# Patient Record
Sex: Female | Born: 1980 | Race: Black or African American | Hispanic: No | Marital: Married | Smoking: Current every day smoker
Health system: Southern US, Community
[De-identification: ages and names within clinical notes are randomized; demographics above are authoritative.]

## PROBLEM LIST (undated history)

## (undated) ENCOUNTER — Emergency Department (HOSPITAL_COMMUNITY): Admission: EM | Payer: Medicaid Other | Source: Home / Self Care

## (undated) DIAGNOSIS — R61 Generalized hyperhidrosis: Secondary | ICD-10-CM

## (undated) DIAGNOSIS — D219 Benign neoplasm of connective and other soft tissue, unspecified: Secondary | ICD-10-CM

## (undated) DIAGNOSIS — I959 Hypotension, unspecified: Secondary | ICD-10-CM

## (undated) DIAGNOSIS — R42 Dizziness and giddiness: Secondary | ICD-10-CM

## (undated) DIAGNOSIS — R110 Nausea: Secondary | ICD-10-CM

## (undated) DIAGNOSIS — R55 Syncope and collapse: Secondary | ICD-10-CM

## (undated) HISTORY — DX: Nausea: R11.0

## (undated) HISTORY — DX: Generalized hyperhidrosis: R61

## (undated) HISTORY — DX: Syncope and collapse: R55

## (undated) HISTORY — DX: Dizziness and giddiness: R42

## (undated) HISTORY — DX: Hypotension, unspecified: I95.9

## (undated) HISTORY — PX: WISDOM TOOTH EXTRACTION: SHX21

---

## 2000-06-07 ENCOUNTER — Emergency Department (HOSPITAL_COMMUNITY): Admission: EM | Admit: 2000-06-07 | Discharge: 2000-06-07 | Payer: Self-pay | Admitting: Emergency Medicine

## 2000-06-07 ENCOUNTER — Encounter: Payer: Self-pay | Admitting: Emergency Medicine

## 2000-07-12 ENCOUNTER — Emergency Department (HOSPITAL_COMMUNITY): Admission: EM | Admit: 2000-07-12 | Discharge: 2000-07-13 | Payer: Self-pay | Admitting: Emergency Medicine

## 2000-08-13 ENCOUNTER — Emergency Department (HOSPITAL_COMMUNITY): Admission: EM | Admit: 2000-08-13 | Discharge: 2000-08-14 | Payer: Self-pay | Admitting: Emergency Medicine

## 2002-03-29 ENCOUNTER — Emergency Department (HOSPITAL_COMMUNITY): Admission: EM | Admit: 2002-03-29 | Discharge: 2002-03-29 | Payer: Self-pay | Admitting: *Deleted

## 2002-05-05 ENCOUNTER — Emergency Department (HOSPITAL_COMMUNITY): Admission: EM | Admit: 2002-05-05 | Discharge: 2002-05-06 | Payer: Self-pay | Admitting: Emergency Medicine

## 2002-09-13 ENCOUNTER — Inpatient Hospital Stay (HOSPITAL_COMMUNITY): Admission: AD | Admit: 2002-09-13 | Discharge: 2002-09-13 | Payer: Self-pay | Admitting: Obstetrics and Gynecology

## 2002-10-13 ENCOUNTER — Emergency Department (HOSPITAL_COMMUNITY): Admission: EM | Admit: 2002-10-13 | Discharge: 2002-10-13 | Payer: Self-pay | Admitting: *Deleted

## 2003-01-26 ENCOUNTER — Emergency Department (HOSPITAL_COMMUNITY): Admission: EM | Admit: 2003-01-26 | Discharge: 2003-01-26 | Payer: Self-pay | Admitting: Emergency Medicine

## 2003-03-14 ENCOUNTER — Encounter: Payer: Self-pay | Admitting: Emergency Medicine

## 2003-03-14 ENCOUNTER — Emergency Department (HOSPITAL_COMMUNITY): Admission: EM | Admit: 2003-03-14 | Discharge: 2003-03-14 | Payer: Self-pay | Admitting: Emergency Medicine

## 2003-06-13 ENCOUNTER — Ambulatory Visit (HOSPITAL_COMMUNITY): Admission: RE | Admit: 2003-06-13 | Discharge: 2003-06-13 | Payer: Self-pay | Admitting: Obstetrics & Gynecology

## 2003-07-09 ENCOUNTER — Inpatient Hospital Stay (HOSPITAL_COMMUNITY): Admission: AD | Admit: 2003-07-09 | Discharge: 2003-07-09 | Payer: Self-pay | Admitting: Obstetrics & Gynecology

## 2003-08-24 ENCOUNTER — Ambulatory Visit (HOSPITAL_COMMUNITY): Admission: RE | Admit: 2003-08-24 | Discharge: 2003-08-24 | Payer: Self-pay | Admitting: Obstetrics & Gynecology

## 2003-09-01 ENCOUNTER — Inpatient Hospital Stay (HOSPITAL_COMMUNITY): Admission: AD | Admit: 2003-09-01 | Discharge: 2003-09-02 | Payer: Self-pay | Admitting: Obstetrics

## 2003-11-24 ENCOUNTER — Inpatient Hospital Stay (HOSPITAL_COMMUNITY): Admission: AD | Admit: 2003-11-24 | Discharge: 2003-11-28 | Payer: Self-pay | Admitting: Obstetrics & Gynecology

## 2004-06-05 ENCOUNTER — Emergency Department (HOSPITAL_COMMUNITY): Admission: EM | Admit: 2004-06-05 | Discharge: 2004-06-05 | Payer: Self-pay | Admitting: Emergency Medicine

## 2008-10-17 ENCOUNTER — Emergency Department (HOSPITAL_COMMUNITY): Admission: EM | Admit: 2008-10-17 | Discharge: 2008-10-17 | Payer: Self-pay | Admitting: Emergency Medicine

## 2009-04-19 ENCOUNTER — Emergency Department (HOSPITAL_COMMUNITY): Admission: EM | Admit: 2009-04-19 | Discharge: 2009-04-19 | Payer: Self-pay | Admitting: Emergency Medicine

## 2010-01-31 ENCOUNTER — Emergency Department (HOSPITAL_COMMUNITY): Admission: EM | Admit: 2010-01-31 | Discharge: 2010-01-31 | Payer: Self-pay | Admitting: Emergency Medicine

## 2010-04-26 ENCOUNTER — Emergency Department (HOSPITAL_COMMUNITY): Admission: EM | Admit: 2010-04-26 | Discharge: 2009-11-01 | Payer: Self-pay | Admitting: Emergency Medicine

## 2012-04-26 ENCOUNTER — Emergency Department (HOSPITAL_COMMUNITY)
Admission: EM | Admit: 2012-04-26 | Discharge: 2012-04-26 | Disposition: A | Discharge status: Home / Self Care | Payer: Medicaid Other | Attending: Emergency Medicine | Admitting: Emergency Medicine

## 2012-04-26 ENCOUNTER — Encounter (HOSPITAL_COMMUNITY): Payer: Self-pay | Admitting: *Deleted

## 2012-04-26 DIAGNOSIS — X500XXA Overexertion from strenuous movement or load, initial encounter: Secondary | ICD-10-CM | POA: Insufficient documentation

## 2012-04-26 DIAGNOSIS — S46819A Strain of other muscles, fascia and tendons at shoulder and upper arm level, unspecified arm, initial encounter: Secondary | ICD-10-CM | POA: Insufficient documentation

## 2012-04-26 DIAGNOSIS — S46811A Strain of other muscles, fascia and tendons at shoulder and upper arm level, right arm, initial encounter: Secondary | ICD-10-CM

## 2012-04-26 DIAGNOSIS — S43499A Other sprain of unspecified shoulder joint, initial encounter: Secondary | ICD-10-CM | POA: Insufficient documentation

## 2012-04-26 DIAGNOSIS — Y9389 Activity, other specified: Secondary | ICD-10-CM | POA: Insufficient documentation

## 2012-04-26 DIAGNOSIS — Y929 Unspecified place or not applicable: Secondary | ICD-10-CM | POA: Insufficient documentation

## 2012-04-26 DIAGNOSIS — M549 Dorsalgia, unspecified: Secondary | ICD-10-CM | POA: Insufficient documentation

## 2012-04-26 DIAGNOSIS — F17200 Nicotine dependence, unspecified, uncomplicated: Secondary | ICD-10-CM | POA: Insufficient documentation

## 2012-04-26 MED ORDER — CYCLOBENZAPRINE HCL 10 MG PO TABS
10.0000 mg | ORAL_TABLET | Freq: Once | ORAL | Status: AC
  Administered 2012-04-26: 10 mg via ORAL
  Filled 2012-04-26: qty 1

## 2012-04-26 MED ORDER — CYCLOBENZAPRINE HCL 10 MG PO TABS: ORAL_TABLET | ORAL | Status: DC

## 2012-04-26 MED ORDER — IBUPROFEN 800 MG PO TABS
800.0000 mg | ORAL_TABLET | Freq: Once | ORAL | Status: AC
  Administered 2012-04-26: 800 mg via ORAL
  Filled 2012-04-26: qty 1

## 2012-04-26 MED ORDER — HYDROCODONE-ACETAMINOPHEN 5-325 MG PO TABS: 1.0000 | ORAL_TABLET | Freq: Four times a day (QID) | ORAL | Status: AC | PRN

## 2012-04-26 MED ORDER — HYDROCODONE-ACETAMINOPHEN 5-325 MG PO TABS
1.0000 | ORAL_TABLET | Freq: Once | ORAL | Status: AC
  Administered 2012-04-26: 1 via ORAL
  Filled 2012-04-26: qty 1

## 2012-04-26 NOTE — ED Provider Notes (Signed)
History     CSN: [id number]  Arrival date & time 04/26/12  1213   First MD Initiated Contact with Patient 04/26/12 1259      Chief Complaint  Patient presents with  . Shoulder Pain    (Consider location/radiation/quality/duration/timing/severity/associated sxs/prior treatment) HPI Comments: Pt was helping her mother move furniture and strained R upper back 2 days ago   Patient is a 31 y.o. female presenting with shoulder pain. The history is provided by the patient. No language interpreter was used.  Shoulder Pain This is a new problem. The current episode started yesterday. The problem occurs constantly. The problem has been unchanged. Pertinent negatives include no numbness or weakness. Exacerbated by: shoulder movement. She has tried acetaminophen for the symptoms. The treatment provided mild relief.    History reviewed. No pertinent past medical history.  History reviewed. No pertinent past surgical history.  No family history on file.  History  Substance Use Topics  . Smoking status: Current Every Day Smoker    Types: Cigarettes  . Smokeless tobacco: Not on file  . Alcohol Use: Yes     Comment: socially    OB History    Grav Para Term Preterm Abortions TAB SAB Ect Mult Living                  Review of Systems  Musculoskeletal: Positive for back pain.  Neurological: Negative for weakness and numbness.  All other systems reviewed and are negative.    Allergies  Review of patient's allergies indicates no known allergies.  Home Medications   Current Outpatient Rx  Name  Route  Sig  Dispense  Refill  . ACETAMINOPHEN 500 MG PO TABS   Oral   Take 1,500 mg by mouth every 6 (six) hours as needed. For pain         . CYCLOBENZAPRINE HCL 10 MG PO TABS      1/2 to one tab po TID   20 tablet   0   . HYDROCODONE-ACETAMINOPHEN 5-325 MG PO TABS   Oral   Take 1 tablet by mouth every 6 (six) hours as needed for pain.   20 tablet   0     BP 143/88   Pulse 87  Temp 98.1 F (36.7 C) (Oral)  Resp 17  Ht 5\' 5"  (1.651 m)  Wt 250 lb (113.399 kg)  BMI 41.60 kg/m2  SpO2 99%  LMP 04/01/2012  Physical Exam  Nursing note and vitals reviewed. Constitutional: She is oriented to person, place, and time. She appears well-developed and well-nourished. No distress.  HENT:  Head: Normocephalic and atraumatic.  Eyes: EOM are normal.  Neck: Normal range of motion.  Cardiovascular: Normal rate and regular rhythm.   Pulmonary/Chest: Effort normal.  Abdominal: Soft. She exhibits no distension. There is no tenderness.  Musculoskeletal: She exhibits tenderness.       Thoracic back: She exhibits decreased range of motion, tenderness and pain. She exhibits no bony tenderness, no swelling, no edema, no deformity, no laceration, no spasm and normal pulse.       Back:  Neurological: She is alert and oriented to person, place, and time.  Skin: Skin is warm and dry.  Psychiatric: She has a normal mood and affect. Judgment normal.    ED Course  Procedures (including critical care time)  Labs Reviewed - No data to display No results found.   1. Strain of right trapezius muscle       MDM  Ice rx-flexeril,  20 rx-hydrocodone, 20 Ibuprofen F/u with PCP        Evalina Field, PA 04/26/12 1424

## 2012-04-26 NOTE — ED Notes (Signed)
Sharp pain under R scapula radiating to spine and up to base of neck.  No pain in shoulder joint.  Tender to light palpation.  Ice pack applied.

## 2012-04-26 NOTE — ED Notes (Signed)
Patient with no complaints at this time. Respirations even and unlabored. Skin warm/dry. Discharge instructions reviewed with patient at this time. Patient given opportunity to voice concerns/ask questions. Patient discharged at this time and left Emergency Department with steady gait.   

## 2012-04-26 NOTE — ED Notes (Signed)
Lifting heavy furniture x 2 days ago - c/o pain to right shoulder.  No obvious deformity.

## 2012-04-27 NOTE — ED Provider Notes (Signed)
Medical screening examination/treatment/procedure(s) were performed by non-physician practitioner and as supervising physician I was immediately available for consultation/collaboration.   Carleene Cooper III, MD 04/27/12 [phone]

## 2012-08-18 ENCOUNTER — Emergency Department (HOSPITAL_COMMUNITY): Payer: Medicaid Other

## 2012-08-18 ENCOUNTER — Emergency Department (HOSPITAL_COMMUNITY)
Admission: EM | Admit: 2012-08-18 | Discharge: 2012-08-18 | Disposition: A | Discharge status: Home / Self Care | Payer: Medicaid Other | Attending: Emergency Medicine | Admitting: Emergency Medicine

## 2012-08-18 ENCOUNTER — Encounter (HOSPITAL_COMMUNITY): Payer: Self-pay | Admitting: *Deleted

## 2012-08-18 DIAGNOSIS — R509 Fever, unspecified: Secondary | ICD-10-CM | POA: Insufficient documentation

## 2012-08-18 DIAGNOSIS — F17200 Nicotine dependence, unspecified, uncomplicated: Secondary | ICD-10-CM | POA: Insufficient documentation

## 2012-08-18 DIAGNOSIS — J40 Bronchitis, not specified as acute or chronic: Secondary | ICD-10-CM

## 2012-08-18 DIAGNOSIS — J209 Acute bronchitis, unspecified: Secondary | ICD-10-CM | POA: Insufficient documentation

## 2012-08-18 DIAGNOSIS — J029 Acute pharyngitis, unspecified: Secondary | ICD-10-CM | POA: Insufficient documentation

## 2012-08-18 DIAGNOSIS — H9209 Otalgia, unspecified ear: Secondary | ICD-10-CM | POA: Insufficient documentation

## 2012-08-18 DIAGNOSIS — R52 Pain, unspecified: Secondary | ICD-10-CM | POA: Insufficient documentation

## 2012-08-18 DIAGNOSIS — J3489 Other specified disorders of nose and nasal sinuses: Secondary | ICD-10-CM | POA: Insufficient documentation

## 2012-08-18 MED ORDER — HYDROCOD POLST-CHLORPHEN POLST 10-8 MG/5ML PO LQCR
5.0000 mL | Freq: Once | ORAL | Status: AC
  Administered 2012-08-18: 5 mL via ORAL
  Filled 2012-08-18: qty 5

## 2012-08-18 MED ORDER — AZITHROMYCIN 250 MG PO TABS: ORAL_TABLET | ORAL | Status: DC

## 2012-08-18 MED ORDER — HYDROCOD POLST-CHLORPHEN POLST 10-8 MG/5ML PO LQCR: 5.0000 mL | Freq: Two times a day (BID) | ORAL | Status: DC | PRN

## 2012-08-18 MED ORDER — ALBUTEROL SULFATE HFA 108 (90 BASE) MCG/ACT IN AERS
2.0000 | INHALATION_SPRAY | Freq: Once | RESPIRATORY_TRACT | Status: AC
  Administered 2012-08-18: 2 via RESPIRATORY_TRACT
  Filled 2012-08-18: qty 6.7

## 2012-08-18 NOTE — ED Provider Notes (Signed)
History     CSN: [id number]  Arrival date & time 08/18/12  1752   First MD Initiated Contact with Patient 08/18/12 1841      Chief Complaint  Patient presents with  . Cough    (Consider location/radiation/quality/duration/timing/severity/associated sxs/prior treatment) HPI Comments: Patient complains of nonproductive cough, chest congestion and nasal congestion, sore throat, low-grade fever and generalized body aches that began 2 days ago. She states that her cough is occasionally productive. She denies wheezing, chest tightness, or shortness of breath. She also reports nasal congestion and pain to her upper teeth when she blows her nose. She denies any vomiting.  Patient is a 32 y.o. female presenting with cough. The history is provided by the patient.  Cough Cough characteristics:  Productive Sputum characteristics:  Clear Severity:  Moderate Onset quality:  Gradual Timing:  Constant Progression:  Worsening Chronicity:  New Smoker: yes   Context: upper respiratory infection   Relieved by:  Nothing Worsened by:  Nothing tried Ineffective treatments: patient took two prednisone tablets yesterday that were left over from previous prescription. Associated symptoms: ear pain, fever, rhinorrhea, sinus congestion and sore throat   Associated symptoms: no chest pain, no chills, no headaches, no myalgias, no shortness of breath and no wheezing     History reviewed. No pertinent past medical history.  History reviewed. No pertinent past surgical history.  No family history on file.  History  Substance Use Topics  . Smoking status: Current Every Day Smoker    Types: Cigarettes  . Smokeless tobacco: Not on file  . Alcohol Use: Yes     Comment: socially    OB History   Grav Para Term Preterm Abortions TAB SAB Ect Mult Living                  Review of Systems  Constitutional: Positive for fever. Negative for chills, activity change and appetite change.  HENT: Positive  for ear pain, congestion, sore throat, rhinorrhea, sneezing and sinus pressure. Negative for facial swelling, trouble swallowing, neck pain and neck stiffness.   Eyes: Negative for visual disturbance.  Respiratory: Positive for cough. Negative for chest tightness, shortness of breath, wheezing and stridor.   Cardiovascular: Negative for chest pain.  Gastrointestinal: Negative for nausea, vomiting and abdominal pain.  Genitourinary: Negative for dysuria.  Musculoskeletal: Negative for myalgias.  Skin: Negative.   Neurological: Negative for dizziness, facial asymmetry, weakness, numbness and headaches.  Hematological: Negative for adenopathy.  Psychiatric/Behavioral: Negative for confusion.  All other systems reviewed and are negative.    Allergies  Review of patient's allergies indicates no known allergies.  Home Medications   Current Outpatient Rx  Name  Route  Sig  Dispense  Refill  . acetaminophen (TYLENOL) 500 MG tablet   Oral   Take 1,500 mg by mouth every 6 (six) hours as needed. For pain         . cyclobenzaprine (FLEXERIL) 10 MG tablet      1/2 to one tab po TID   20 tablet   0     BP 133/103  Pulse 90  Temp(Src) 98.5 F (36.9 C) (Oral)  Resp 20  SpO2 99%  LMP 07/31/2012  Physical Exam  Nursing note and vitals reviewed. Constitutional: She is oriented to person, place, and time. She appears well-developed and well-nourished. No distress.  HENT:  Head: Normocephalic and atraumatic. No trismus in the jaw.  Right Ear: Tympanic membrane and ear canal normal.  Left Ear: Tympanic membrane and  ear canal normal.  Nose: Mucosal edema and rhinorrhea present.  Mouth/Throat: Uvula is midline, oropharynx is clear and moist and mucous membranes are normal. No edematous. No oropharyngeal exudate.  Eyes: EOM are normal. Pupils are equal, round, and reactive to light.  Neck: Normal range of motion. Neck supple.  Cardiovascular: Normal rate, regular rhythm, normal heart  sounds and intact distal pulses.   No murmur heard. Pulmonary/Chest: Effort normal. No respiratory distress. She has no rales. She exhibits no tenderness.  Coarse lungs sounds bilaterally.  No wheezing or rales  Abdominal: Soft. She exhibits no distension. There is no tenderness.  Musculoskeletal: She exhibits no edema.  Lymphadenopathy:    She has no cervical adenopathy.  Neurological: She is alert and oriented to person, place, and time. She exhibits normal muscle tone. Coordination normal.  Skin: Skin is warm and dry.    ED Course  Procedures (including critical care time)  Labs Reviewed - No data to display Dg Chest 2 View  08/18/2012  *RADIOLOGY REPORT*  Clinical Data: Cough, history of smoking  CHEST - 2 VIEW  Comparison: None.  Findings:  Normal cardiac silhouette and mediastinal contours.  There is mild diffuse thickening of the pulmonary interstitium.  No focal airspace opacities.  There is mild eventration of the bilateral hemidiaphragms, right greater than left.  No pleural effusion or pneumothorax.  No acute osseous abnormalities.  IMPRESSION: Suspected mild bronchitic change without acute cardiopulmonary disease.   Original Report Authenticated By: Tacey Ruiz, MD         MDM    Nursing notes, vital signs, and x-ray results were considered by me.  Patient has coarse lung sounds bilaterally that improved after cough.  No hypoxia, tachycardia, or tachypnea to suggest PE. She ambulated to the restroom with a steady gait. No focal neuro deficits on exam. Patient is nontoxic appearing. Patient agrees to followup with her primary care physician for recheck  Albuterol inhaler was dispensed from the department she was given Tussionex by mouth. Cough has improved.  She is feeling much better and is ready for discharge.  Prescribed Zithromax and Tussionex  The patient appears reasonably screened and/or stabilized for discharge and I doubt any other medical condition or other [facility]  requiring further screening, evaluation, or treatment in the ED at this time prior to discharge.   Rochell Puett L. Trisha Mangle, PA-C 08/18/12 1933

## 2012-08-18 NOTE — ED Notes (Signed)
Pt c/o sore throat, fever, cough that is ? Non productive, teeth pain. Dizzy, body aches that started Saturday.

## 2012-08-19 NOTE — ED Provider Notes (Signed)
Medical screening examination/treatment/procedure(s) were performed by non-physician practitioner and as supervising physician I was immediately available for consultation/collaboration.   Laray Anger, DO 08/19/12 1652

## 2013-01-07 ENCOUNTER — Emergency Department (HOSPITAL_COMMUNITY)
Admission: EM | Admit: 2013-01-07 | Discharge: 2013-01-07 | Disposition: A | Discharge status: Home / Self Care | Payer: Medicaid Other | Attending: Emergency Medicine | Admitting: Emergency Medicine

## 2013-01-07 ENCOUNTER — Encounter (HOSPITAL_COMMUNITY): Payer: Self-pay | Admitting: Emergency Medicine

## 2013-01-07 DIAGNOSIS — Z3202 Encounter for pregnancy test, result negative: Secondary | ICD-10-CM | POA: Insufficient documentation

## 2013-01-07 DIAGNOSIS — N390 Urinary tract infection, site not specified: Secondary | ICD-10-CM

## 2013-01-07 DIAGNOSIS — F17200 Nicotine dependence, unspecified, uncomplicated: Secondary | ICD-10-CM | POA: Insufficient documentation

## 2013-01-07 LAB — CBC WITH DIFFERENTIAL/PLATELET
Eosinophils Absolute: 0.4 10*3/uL (ref 0.0–0.7)
Hemoglobin: 14.2 g/dL (ref 12.0–15.0)
Lymphocytes Relative: 32 % (ref 12–46)
Lymphs Abs: 2.7 10*3/uL (ref 0.7–4.0)
MCH: 31.2 pg (ref 26.0–34.0)
MCV: 89 fL (ref 78.0–100.0)
Monocytes Relative: 8 % (ref 3–12)
Neutrophils Relative %: 55 % (ref 43–77)
RBC: 4.55 MIL/uL (ref 3.87–5.11)
WBC: 8.3 10*3/uL (ref 4.0–10.5)

## 2013-01-07 LAB — URINALYSIS, ROUTINE W REFLEX MICROSCOPIC
Bilirubin Urine: NEGATIVE
Glucose, UA: NEGATIVE mg/dL
Specific Gravity, Urine: 1.015 (ref 1.005–1.030)
pH: 8.5 — ABNORMAL HIGH (ref 5.0–8.0)

## 2013-01-07 LAB — BASIC METABOLIC PANEL
BUN: 5 mg/dL — ABNORMAL LOW (ref 6–23)
CO2: 25 mEq/L (ref 19–32)
GFR calc non Af Amer: >90 mL/min (ref >90)
Glucose, Bld: 99 mg/dL (ref 70–99)
Potassium: 3.8 mEq/L (ref 3.5–5.1)
Sodium: 136 mEq/L (ref 135–145)

## 2013-01-07 LAB — PREGNANCY, URINE: Preg Test, Ur: NEGATIVE

## 2013-01-07 LAB — URINE MICROSCOPIC-ADD ON

## 2013-01-07 MED ORDER — NITROFURANTOIN MONOHYD MACRO 100 MG PO CAPS: 100.0000 mg | ORAL_CAPSULE | Freq: Two times a day (BID) | ORAL | Status: DC

## 2013-01-07 MED ORDER — OXYCODONE-ACETAMINOPHEN 5-325 MG PO TABS: 2.0000 | ORAL_TABLET | ORAL | Status: DC | PRN

## 2013-01-07 MED ORDER — SODIUM CHLORIDE 0.9 % IV BOLUS (SEPSIS)
500.0000 mL | Freq: Once | INTRAVENOUS | Status: AC
  Administered 2013-01-07: 500 mL via INTRAVENOUS

## 2013-01-07 MED ORDER — MORPHINE SULFATE 4 MG/ML IJ SOLN
4.0000 mg | Freq: Once | INTRAMUSCULAR | Status: AC
  Administered 2013-01-07: 4 mg via INTRAVENOUS
  Filled 2013-01-07 (×2): qty 1

## 2013-01-07 MED ORDER — ONDANSETRON HCL 4 MG/2ML IJ SOLN
4.0000 mg | Freq: Once | INTRAMUSCULAR | Status: AC
  Administered 2013-01-07: 4 mg via INTRAVENOUS
  Filled 2013-01-07 (×2): qty 2

## 2013-01-07 MED ORDER — KETOROLAC TROMETHAMINE 30 MG/ML IJ SOLN
30.0000 mg | Freq: Once | INTRAMUSCULAR | Status: AC
  Administered 2013-01-07: 30 mg via INTRAVENOUS
  Filled 2013-01-07: qty 1

## 2013-01-07 MED ORDER — DEXTROSE 5 % IV SOLN
1.0000 g | Freq: Once | INTRAVENOUS | Status: AC
  Administered 2013-01-07: 1 g via INTRAVENOUS
  Filled 2013-01-07: qty 10

## 2013-01-07 MED ORDER — PROMETHAZINE HCL 25 MG PO TABS: 25.0000 mg | ORAL_TABLET | Freq: Four times a day (QID) | ORAL | Status: DC | PRN

## 2013-01-07 NOTE — ED Notes (Signed)
Pt c/o RLQ/right groin pain. Sudden onset 1 hour pta. Pt denies NVD. Pt states pain is worse with movement.

## 2013-01-07 NOTE — ED Notes (Signed)
Pt c/o sudden rlq pain "almost at my groin" started suddenly pta. Denies n/v/d. States feels like she needs a bm. Pain is worse with movement.

## 2013-01-07 NOTE — ED Provider Notes (Signed)
CSN: [id number]     Arrival date & time 01/07/13  1817 History     First MD Initiated Contact with Patient 01/07/13 1829     Chief Complaint  Patient presents with  . Abdominal Pain   (Consider location/radiation/quality/duration/timing/severity/associated sxs/prior Treatment) HPI..... sharp right lower quadrant pain approximately 45 minutes prior to admission. LMP 2 weeks ago. No hematuria, dysuria, fever, chills, vaginal bleeding, vaginal discharge. Patient is able to eat.  Positioning makes pain worse.  Severity is mild to moderate.   History reviewed. No pertinent past medical history. History reviewed. No pertinent past surgical history. History reviewed. No pertinent family history. History  Substance Use Topics  . Smoking status: Current Every Day Smoker    Types: Cigarettes  . Smokeless tobacco: Not on file  . Alcohol Use: Yes     Comment: socially   OB History   Grav Para Term Preterm Abortions TAB SAB Ect Mult Living                 Review of Systems  All other systems reviewed and are negative.    Allergies  Review of patient's allergies indicates no known allergies.  Home Medications   Current Outpatient Rx  Name  Route  Sig  Dispense  Refill  . nitrofurantoin, macrocrystal-monohydrate, (MACROBID) 100 MG capsule   Oral   Take 1 capsule (100 mg total) by mouth 2 (two) times daily. X 7 days   14 capsule   0   . oxyCODONE-acetaminophen (PERCOCET) 5-325 MG per tablet   Oral   Take 2 tablets by mouth every 4 (four) hours as needed for pain.   15 tablet   0   . promethazine (PHENERGAN) 25 MG tablet   Oral   Take 1 tablet (25 mg total) by mouth every 6 (six) hours as needed for nausea.   10 tablet   0    BP 124/82  Pulse 78  Temp(Src) 98.6 F (37 C)  Resp 18  Ht 5\' 8"  (1.727 m)  Wt 250 lb (113.399 kg)  BMI 38.02 kg/m2  SpO2 99%  LMP 12/24/2012 Physical Exam  Nursing note and vitals reviewed. Constitutional: She is oriented to person,  place, and time. She appears well-developed and well-nourished.  HENT:  Head: Normocephalic and atraumatic.  Eyes: Conjunctivae and EOM are normal. Pupils are equal, round, and reactive to light.  Neck: Normal range of motion. Neck supple.  Cardiovascular: Normal rate, regular rhythm and normal heart sounds.   Pulmonary/Chest: Effort normal and breath sounds normal.  Abdominal: Soft. Bowel sounds are normal.  Minimal tenderness right flank, right lower quadrant  Musculoskeletal: Normal range of motion.  Neurological: She is alert and oriented to person, place, and time.  Skin: Skin is warm and dry.  Psychiatric: She has a normal mood and affect.    ED Course   Procedures (including critical care time)  Labs Reviewed  BASIC METABOLIC PANEL - Abnormal; Notable for the following:    BUN 5 (*)    All other components within normal limits  URINALYSIS, ROUTINE W REFLEX MICROSCOPIC - Abnormal; Notable for the following:    APPearance CLOUDY (*)    pH 8.5 (*)    Hgb urine dipstick TRACE (*)    Leukocytes, UA LARGE (*)    All other components within normal limits  URINE MICROSCOPIC-ADD ON - Abnormal; Notable for the following:    Squamous Epithelial / LPF MANY (*)    Bacteria, UA MANY (*)  All other components within normal limits  URINE CULTURE  CBC WITH DIFFERENTIAL  PREGNANCY, URINE   No results found. 1. Urinary tract infection     MDM  No acute abdomen.  Urinalysis grossly infected. IV Rocephin. No clinical evidence of appendicitis.  Discharge meds Macrobid, Phenergan 25 mg, Percocet.  Donnetta Hutching, MD 01/07/13 2141

## 2013-01-09 LAB — URINE CULTURE

## 2013-01-10 ENCOUNTER — Telehealth (HOSPITAL_COMMUNITY): Payer: Self-pay | Admitting: Emergency Medicine

## 2013-01-10 NOTE — ED Notes (Signed)
Post ED Visit - Positive Culture Follow-up  Culture report reviewed by antimicrobial stewardship pharmacist: []  Wes Dulaney, Pharm.D., BCPS []  Celedonio Miyamoto, Pharm.D., BCPS [x]  Georgina Pillion, Pharm.D., BCPS []  [place], [address].D., BCPS, AAHIVP []  Estella Husk, Pharm.D., BCPS, AAHIVP  Positive urine culture Treated with Macrobid, organism sensitive to the same and no further patient follow-up is required at this time.  Kylie A Holland 01/10/2013, 2:38 PM

## 2013-01-11 ENCOUNTER — Ambulatory Visit
Admission: RE | Admit: 2013-01-11 | Discharge: 2013-01-11 | Disposition: A | Discharge status: Home / Self Care | Payer: Medicaid Other | Source: Ambulatory Visit | Attending: Family Medicine | Admitting: Family Medicine

## 2013-01-11 ENCOUNTER — Other Ambulatory Visit: Payer: Self-pay | Admitting: Family Medicine

## 2013-01-11 DIAGNOSIS — R112 Nausea with vomiting, unspecified: Secondary | ICD-10-CM

## 2013-01-11 DIAGNOSIS — R1032 Left lower quadrant pain: Secondary | ICD-10-CM

## 2013-01-11 MED ORDER — IOHEXOL 300 MG/ML  SOLN: 125.0000 mL | Freq: Once | INTRAMUSCULAR | Status: AC | PRN

## 2013-10-06 ENCOUNTER — Encounter (HOSPITAL_COMMUNITY): Payer: Self-pay | Admitting: Emergency Medicine

## 2013-10-06 ENCOUNTER — Emergency Department (HOSPITAL_COMMUNITY)
Admission: EM | Admit: 2013-10-06 | Discharge: 2013-10-06 | Disposition: A | Discharge status: Home / Self Care | Payer: Medicaid Other | Attending: Emergency Medicine | Admitting: Emergency Medicine

## 2013-10-06 DIAGNOSIS — Z79899 Other long term (current) drug therapy: Secondary | ICD-10-CM | POA: Insufficient documentation

## 2013-10-06 DIAGNOSIS — F17200 Nicotine dependence, unspecified, uncomplicated: Secondary | ICD-10-CM | POA: Insufficient documentation

## 2013-10-06 DIAGNOSIS — Z792 Long term (current) use of antibiotics: Secondary | ICD-10-CM | POA: Insufficient documentation

## 2013-10-06 DIAGNOSIS — J029 Acute pharyngitis, unspecified: Secondary | ICD-10-CM | POA: Insufficient documentation

## 2013-10-06 LAB — RAPID STREP SCREEN (MED CTR MEBANE ONLY): Streptococcus, Group A Screen (Direct): NEGATIVE

## 2013-10-06 MED ORDER — NAPROXEN 500 MG PO TABS: 500.0000 mg | ORAL_TABLET | Freq: Two times a day (BID) | ORAL | Status: DC

## 2013-10-06 MED ORDER — HYDROCODONE-ACETAMINOPHEN 7.5-325 MG/15ML PO SOLN: 15.0000 mL | Freq: Four times a day (QID) | ORAL | Status: AC | PRN

## 2013-10-06 MED ORDER — AMOXICILLIN 500 MG PO CAPS: 500.0000 mg | ORAL_CAPSULE | Freq: Three times a day (TID) | ORAL | Status: DC

## 2013-10-06 NOTE — ED Provider Notes (Signed)
CSN: [id number]     Arrival date & time 10/06/13  7035 History  This chart was scribed for Maudry Diego, MD by Ludger Nutting, ED Scribe. This patient was seen in room APA06/APA06 and the patient's care was started 7:27 AM.    Chief Complaint  Patient presents with  . Sore Throat      Patient is a 33 y.o. female presenting with pharyngitis. The history is provided by the patient. No language interpreter was used.  Sore Throat This is a new problem. The current episode started 1 to 2 hours ago. The problem occurs constantly. The problem has been gradually worsening. Pertinent negatives include no chest pain, no abdominal pain and no headaches. The symptoms are aggravated by swallowing. Nothing relieves the symptoms. She has tried nothing for the symptoms.    HPI Comments: Gina Roth is a 33 y.o. female who presents to the Emergency Department complaining of 2 days of gradual onset, gradually worsening, constant sore throat. She also reports associated fever and chills. She states swallowing worsens the pain. She denies cough, rhinorrhea.    History reviewed. No pertinent past medical history. History reviewed. No pertinent past surgical history. History reviewed. No pertinent family history. History  Substance Use Topics  . Smoking status: Current Every Day Smoker    Types: Cigarettes  . Smokeless tobacco: Not on file  . Alcohol Use: Yes     Comment: socially   OB History   Grav Para Term Preterm Abortions TAB SAB Ect Mult Living                 Review of Systems  Constitutional: Positive for fever and chills. Negative for appetite change and fatigue.  HENT: Positive for sore throat. Negative for congestion, ear discharge, rhinorrhea, sinus pressure and trouble swallowing.   Eyes: Negative for discharge.  Respiratory: Negative for cough.   Cardiovascular: Negative for chest pain.  Gastrointestinal: Negative for abdominal pain and diarrhea.  Genitourinary: Negative for  frequency and hematuria.  Musculoskeletal: Negative for back pain.  Skin: Negative for rash.  Neurological: Negative for seizures and headaches.  Psychiatric/Behavioral: Negative for hallucinations.      Allergies  Review of patient's allergies indicates no known allergies.  Home Medications   Prior to Admission medications   Medication Sig Start Date End Date Taking? Authorizing Provider  nitrofurantoin, macrocrystal-monohydrate, (MACROBID) 100 MG capsule Take 1 capsule (100 mg total) by mouth 2 (two) times daily. X 7 days 01/07/13   Nat Christen, MD  oxyCODONE-acetaminophen (PERCOCET) 5-325 MG per tablet Take 2 tablets by mouth every 4 (four) hours as needed for pain. 01/07/13   Nat Christen, MD  promethazine (PHENERGAN) 25 MG tablet Take 1 tablet (25 mg total) by mouth every 6 (six) hours as needed for nausea. 01/07/13   Nat Christen, MD   BP 123/87  Pulse 98  Temp(Src) 99 F (37.2 C) (Oral)  Resp 18  Ht 5\' 8"  (1.727 m)  Wt 230 lb (104.327 kg)  BMI 34.98 kg/m2  SpO2 97%  LMP 10/01/2013 Physical Exam  Nursing note and vitals reviewed. Constitutional: She is oriented to person, place, and time. She appears well-developed.  HENT:  Head: Normocephalic and atraumatic.  Right Ear: External ear normal.  Left Ear: External ear normal.  Mouth/Throat: Posterior oropharyngeal erythema present. No oropharyngeal exudate.  Oropharynx is mildy inflamed.   Eyes: Conjunctivae and EOM are normal. No scleral icterus.  Neck: Neck supple. No thyromegaly present.  Cardiovascular: Normal rate, regular  rhythm and normal heart sounds.  Exam reveals no gallop and no friction rub.   No murmur heard. Pulmonary/Chest: Effort normal and breath sounds normal. No stridor. She has no wheezes. She has no rales. She exhibits no tenderness.  Abdominal: Soft. She exhibits no distension. There is no tenderness. There is no rebound.  Musculoskeletal: Normal range of motion. She exhibits no edema.  Lymphadenopathy:        Head (left side): Preauricular adenopathy present.    She has no cervical adenopathy.  Tender and swollen lymph nodes, preauricular on the left and inferior to left ear   Neurological: She is oriented to person, place, and time. She exhibits normal muscle tone. Coordination normal.  Skin: No rash noted. No erythema.  Psychiatric: She has a normal mood and affect. Her behavior is normal.    ED Course  Procedures (including critical care time)  DIAGNOSTIC STUDIES: Oxygen Saturation is 97% on RA, adequate by my interpretation.    COORDINATION OF CARE: 7:42 AM Will order strep test. Discussed treatment plan with pt at bedside and pt agreed to plan.   Labs Review Labs Reviewed - No data to display  Imaging Review No results found.   EKG Interpretation None      MDM   Final diagnoses:  None   The chart was scribed for me under my direct supervision.  I personally performed the history, physical, and medical decision making and all procedures in the evaluation of this patient.Maudry Diego, MD 10/06/13 [phone]

## 2013-10-06 NOTE — ED Notes (Signed)
sorethroat since Monday.

## 2013-10-06 NOTE — Discharge Instructions (Signed)
Follow up next week if not improving. °

## 2013-10-08 LAB — CULTURE, GROUP A STREP

## 2015-03-29 ENCOUNTER — Emergency Department (HOSPITAL_COMMUNITY): Payer: BC Managed Care – PPO

## 2015-03-29 ENCOUNTER — Emergency Department (HOSPITAL_COMMUNITY)
Admission: EM | Admit: 2015-03-29 | Discharge: 2015-03-29 | Disposition: A | Discharge status: Home / Self Care | Payer: BC Managed Care – PPO | Attending: Emergency Medicine | Admitting: Emergency Medicine

## 2015-03-29 ENCOUNTER — Encounter (HOSPITAL_COMMUNITY): Payer: Self-pay | Admitting: Emergency Medicine

## 2015-03-29 DIAGNOSIS — Z87891 Personal history of nicotine dependence: Secondary | ICD-10-CM | POA: Insufficient documentation

## 2015-03-29 DIAGNOSIS — D259 Leiomyoma of uterus, unspecified: Secondary | ICD-10-CM | POA: Diagnosis not present

## 2015-03-29 DIAGNOSIS — Z3202 Encounter for pregnancy test, result negative: Secondary | ICD-10-CM | POA: Diagnosis not present

## 2015-03-29 DIAGNOSIS — R319 Hematuria, unspecified: Secondary | ICD-10-CM | POA: Diagnosis present

## 2015-03-29 DIAGNOSIS — D219 Benign neoplasm of connective and other soft tissue, unspecified: Secondary | ICD-10-CM

## 2015-03-29 LAB — CBC WITH DIFFERENTIAL/PLATELET
Basophils Absolute: 0 10*3/uL (ref 0.0–0.1)
Basophils Relative: 0 %
Eosinophils Absolute: 0.3 10*3/uL (ref 0.0–0.7)
Eosinophils Relative: 4 %
HCT: 38.8 % (ref 36.0–46.0)
Hemoglobin: 13.3 g/dL (ref 12.0–15.0)
Lymphocytes Relative: 43 %
Lymphs Abs: 2.7 10*3/uL (ref 0.7–4.0)
MCH: 30.4 pg (ref 26.0–34.0)
MCHC: 34.3 g/dL (ref 30.0–36.0)
MCV: 88.6 fL (ref 78.0–100.0)
Monocytes Absolute: 0.6 10*3/uL (ref 0.1–1.0)
Monocytes Relative: 10 %
Neutro Abs: 2.8 10*3/uL (ref 1.7–7.7)
Neutrophils Relative %: 43 %
Platelets: 329 10*3/uL (ref 150–400)
RBC: 4.38 MIL/uL (ref 3.87–5.11)
RDW: 13.7 % (ref 11.5–15.5)
WBC: 6.4 10*3/uL (ref 4.0–10.5)

## 2015-03-29 LAB — POC URINE PREG, ED: PREG TEST UR: NEGATIVE

## 2015-03-29 LAB — URINE MICROSCOPIC-ADD ON

## 2015-03-29 LAB — BASIC METABOLIC PANEL
Anion gap: 7 (ref 5–15)
BUN: 12 mg/dL (ref 6–20)
CO2: 24 mmol/L (ref 22–32)
Calcium: 9.1 mg/dL (ref 8.9–10.3)
Chloride: 105 mmol/L (ref 101–111)
Creatinine, Ser: 0.77 mg/dL (ref 0.44–1.00)
GFR calc Af Amer: >60 mL/min (ref >60)
GFR calc non Af Amer: >60 mL/min (ref >60)
Glucose, Bld: 93 mg/dL (ref 65–99)
Potassium: 3.8 mmol/L (ref 3.5–5.1)
Sodium: 136 mmol/L (ref 135–145)

## 2015-03-29 LAB — URINALYSIS, ROUTINE W REFLEX MICROSCOPIC
Bilirubin Urine: NEGATIVE
Glucose, UA: NEGATIVE mg/dL
Ketones, ur: NEGATIVE mg/dL
Leukocytes, UA: NEGATIVE
Nitrite: NEGATIVE
Protein, ur: NEGATIVE mg/dL
Specific Gravity, Urine: 1.025 (ref 1.005–1.030)
UROBILINOGEN UA: 0.2 mg/dL (ref 0.0–1.0)
pH: 5.5 (ref 5.0–8.0)

## 2015-03-29 MED ORDER — OXYCODONE-ACETAMINOPHEN 5-325 MG PO TABS: 1.0000 | ORAL_TABLET | ORAL | Status: DC | PRN

## 2015-03-29 MED ORDER — HYDROMORPHONE HCL 1 MG/ML IJ SOLN
1.0000 mg | Freq: Once | INTRAMUSCULAR | Status: AC
  Administered 2015-03-29: 1 mg via INTRAVENOUS
  Filled 2015-03-29: qty 1

## 2015-03-29 MED ORDER — PROMETHAZINE HCL 25 MG PO TABS: 25.0000 mg | ORAL_TABLET | Freq: Four times a day (QID) | ORAL | Status: DC | PRN

## 2015-03-29 MED ORDER — ONDANSETRON HCL 4 MG/2ML IJ SOLN
4.0000 mg | Freq: Once | INTRAMUSCULAR | Status: AC
  Administered 2015-03-29: 4 mg via INTRAVENOUS
  Filled 2015-03-29: qty 2

## 2015-03-29 NOTE — ED Notes (Signed)
Pt in ultrasound

## 2015-03-29 NOTE — Discharge Instructions (Signed)
Uterine Fibroids Uterine fibroids are tissue masses (tumors). They are also called leiomyomas. They can develop inside of a woman's womb (uterus). They can grow very large. Fibroids are not cancerous (benign). Most fibroids do not require medical treatment. HOME CARE  Keep all follow-up visits as told by your doctor. This is important.  Take medicines only as told by your doctor.  If you were prescribed a hormone treatment, take the hormone medicines exactly as told.  Do not take aspirin. It can cause bleeding.  Ask your doctor about taking iron pills and increasing the amount of dark green, leafy vegetables in your diet. These actions can help to boost your blood iron levels.  Pay close attention to your period. Tell your doctor about any changes, such as:  Increased blood flow. This may require you to use more pads or tampons than usual per month.  A change in the number of days that your period lasts per month.  A change in symptoms that come with your period, such as back pain or cramping in your belly area (abdomen). GET HELP IF:  You have pain in your back or the area between your hip bones (pelvic area) that is not controlled by medicines.  You have pain in your abdomen that is not controlled with medicines.  You have an increase in bleeding between and during periods.  You soak tampons or pads in a half hour or less.  You feel lightheaded.  You feel extra tired.  You feel weak. GET HELP RIGHT AWAY IF:   You pass out (faint).  You have a sudden increase in pelvic pain.   This information is not intended to replace advice given to you by your health care provider. Make sure you discuss any questions you have with your health care provider.   Document Released: 06/08/2010 Document Revised: 05/27/2014 Document Reviewed: 11/02/2013 Elsevier Interactive Patient Education [employer].

## 2015-03-29 NOTE — ED Notes (Signed)
Pt reports was seen at pcp office and px abx and vodin. Pt reports lower abdominal pain,hematuria. Pt reports history of same. Pt denies any fever,n/v/d.

## 2015-03-29 NOTE — ED Notes (Signed)
POC urine pregnancy negative. 

## 2015-03-30 ENCOUNTER — Telehealth: Payer: Self-pay | Admitting: Orthopedic Surgery

## 2015-03-30 ENCOUNTER — Ambulatory Visit (INDEPENDENT_AMBULATORY_CARE_PROVIDER_SITE_OTHER): Payer: BC Managed Care – PPO | Admitting: Obstetrics and Gynecology

## 2015-03-30 VITALS — BP 100/72 | Ht 67.0 in | Wt 250.0 lb

## 2015-03-30 DIAGNOSIS — D252 Subserosal leiomyoma of uterus: Secondary | ICD-10-CM | POA: Insufficient documentation

## 2015-03-30 DIAGNOSIS — R350 Frequency of micturition: Secondary | ICD-10-CM | POA: Diagnosis not present

## 2015-03-30 DIAGNOSIS — M7918 Myalgia, other site: Secondary | ICD-10-CM

## 2015-03-30 DIAGNOSIS — M545 Low back pain: Secondary | ICD-10-CM | POA: Diagnosis not present

## 2015-03-30 MED ORDER — METHOCARBAMOL 500 MG PO TABS: 500.0000 mg | ORAL_TABLET | Freq: Four times a day (QID) | ORAL | Status: DC

## 2015-03-30 NOTE — Telephone Encounter (Signed)
You had addressed this today - at patient's son's appointment Almon Register) and advised that after trying the medication prescribed by Dr Glo Herring, if not better, to call back to schedule appointment.

## 2015-03-30 NOTE — Progress Notes (Addendum)
[facility] Visit  Patient name: Gina Roth MRN [id number]  Date of birth: [birth year]  CC & HPI:  Gina Roth is a 34 y.o. female presenting today for follow up from the ED yesterday. Pt was seen for her severe, 10/10, right lower abdominal pain. She states she was dx with a left sided uterine fibroid around 1 year ago but she notes it has not been been giving her any significant pain over the past year. Pt reports associated mild urinary frequency and right sided low back pain. She notes in the ER yesterday she had a CT done to check for kidney stones and transvaginal and pelvic US which resulted in the following:  Soft tissue density within the left hemipelvis is favored to represent an exophytic fibroid. 5.1 cm (image 83 , series 2). This is enlarged from 2.9 cm on the prior exam. Pt had labs and urine done in ER yesterday which were reviewed and came back normal. Pt states she was given rx for Percocet which she has been taking with no significant relief. She reports the pain is exacerbated with trying to stand up but states the pain does not radiate down into her right leg. She states her last bowel movement was 2 days ago. Pt has had 1 child, 11 years ago that was delivered vaginally. She notes she has not been using any form of birth control since and has not gotten pregnant. She denies any nausea, vomiting, or other urinary symptoms. LNMP was 03/09/15 and her periods are typically regular. She denies any h/o abdominal surgeries.  PCP: Dr. Juanita Roth at [place]:  [place] reviewed and all are negative for acute change except as noted in the HPI.   Pertinent History Reviewed:   Reviewed: Significant for none Medical        No past medical history on file.                            Surgical Hx:    Past Surgical History  Procedure Laterality Date  . Wisdom tooth extraction     Medications: Reviewed & Updated - see associated section                     Current outpatient prescriptions:  .  oxyCODONE-acetaminophen (PERCOCET/ROXICET) 5-325 MG tablet, Take 1 tablet by mouth every 4 (four) hours as needed., Disp: 15 tablet, Rfl: 0 .  ciprofloxacin (CIPRO) 500 MG tablet, Take 1 tablet by mouth 2 (two) times daily., Disp: , Rfl:  .  HYDROcodone-acetaminophen (NORCO/VICODIN) 5-325 MG tablet, Take 1 tablet by mouth every 4 (four) hours as needed., Disp: , Rfl:  .  ibuprofen (ADVIL,MOTRIN) 200 MG tablet, Take 400 mg by mouth every 6 (six) hours as needed for moderate pain., Disp: , Rfl:  .  promethazine (PHENERGAN) 25 MG tablet, Take 1 tablet (25 mg total) by mouth every 6 (six) hours as needed for nausea or vomiting. (Patient not taking: Reported on 03/30/2015), Disp: 15 tablet, Rfl: 0   Social History: Reviewed -  reports that she quit smoking about 2 months ago. Her smoking use included Cigarettes. She does not have any smokeless tobacco history on file.  Objective Findings:  Vitals: Blood pressure 100/72, height 5\' 7"  (1.702 m), weight 250 lb (113.399 kg), last menstrual period 03/09/2015.  Physical Examination: General appearance - alert, well appearing, and in no distress Mental  status - alert, oriented to person, place, and time Pelvic - normal appearing secretions, normal cervix with only mild tenderness to cervical contact, uterus feels irregular on left side Abdomen: soft, bowel sounds normal. RLQ tenderness Neurological - alert, oriented, normal speech, no focal findings or movement disorder noted Musculoskeletal - no joint tenderness, deformity or swelling. SLR to 30 degrees on right, SLR to 45 degrees on left. Obturators sign was negative Extremities - peripheral pulses normal, no pedal edema, no clubbing or cyanosis  [place] reviewed: Endometrium thickness in 1.8 cm , the pt is in secretory phase, so this is normal.  Assessment & Plan:   A:  1. Right lower quadrant musculoskeletal pain 2. Left sided uterine fibroid  stable not causing pain 3  Constipation, not felt to be a major problem  P:  1. Will give rx for MagCitrate and Robaxin 2. Will refer to Dr. Aline Brochure who is seeing their son this pm.  By signing my name below, I, Erling Conte, attest that this documentation has been prepared under the direction and in the presence of Jonnie Kind, MD. Electronically Signed: Erling Conte, ED Scribe. 03/30/2015. 11:49 AM.  I personally performed the services described in this documentation, which was SCRIBED in my presence. The recorded information has been reviewed and considered accurate. It has been edited as necessary during review. Jonnie Kind, MD     Over 30 minutes spent in pt exam, Ros, and review of labs, CT. u/s

## 2015-03-30 NOTE — Telephone Encounter (Signed)
Patient called following referral by Dr. Glo Herring; also seen by primary care, and [facility] Emergency room as well for:  1. Right lower quadrant musculoskeletal pain 2. Left sided uterine fibroid stable not causing pain 3 Constipation, not felt to be a major problem P:  1. Will give rx for MagCitrate and Robaxin 2. Will refer to Dr. Aline Brochure who is seeing their son this pm.  Patient states everything ruled out; therefore, has been recommended for orthopaedics.  Patient made aware that it would not be today at time of son's appointment, due to Dr Harrison's clinic and on call hospital schedule.  Please review and advise. [place] # [phone]

## 2015-03-30 NOTE — Progress Notes (Signed)
Patient ID: Gina Roth, female   DOB: 04-Dec-1980, 34 y.o.   MRN: [id number] Pt here today for follow up from the ED yesterday. Pt states that she is in a lot of pain and the pain medication is not helping at all. Pt states that she has been taking the percocet and it is not helping at all pt is getting no relief from the medication at all. Pt rates her pain a 15 on a scale from 0-10.

## 2015-03-30 NOTE — Telephone Encounter (Signed)
What?

## 2015-03-31 ENCOUNTER — Encounter (HOSPITAL_COMMUNITY): Payer: Self-pay | Admitting: Emergency Medicine

## 2015-03-31 ENCOUNTER — Emergency Department (HOSPITAL_COMMUNITY)
Admission: EM | Admit: 2015-03-31 | Discharge: 2015-03-31 | Disposition: A | Discharge status: Home / Self Care | Payer: BC Managed Care – PPO | Attending: Emergency Medicine | Admitting: Emergency Medicine

## 2015-03-31 ENCOUNTER — Emergency Department (HOSPITAL_COMMUNITY): Payer: BC Managed Care – PPO

## 2015-03-31 ENCOUNTER — Encounter: Payer: Self-pay | Admitting: Obstetrics and Gynecology

## 2015-03-31 DIAGNOSIS — R109 Unspecified abdominal pain: Secondary | ICD-10-CM | POA: Insufficient documentation

## 2015-03-31 DIAGNOSIS — Z86018 Personal history of other benign neoplasm: Secondary | ICD-10-CM | POA: Diagnosis not present

## 2015-03-31 DIAGNOSIS — M7918 Myalgia, other site: Secondary | ICD-10-CM

## 2015-03-31 DIAGNOSIS — M25551 Pain in right hip: Secondary | ICD-10-CM | POA: Insufficient documentation

## 2015-03-31 DIAGNOSIS — Z3202 Encounter for pregnancy test, result negative: Secondary | ICD-10-CM | POA: Insufficient documentation

## 2015-03-31 DIAGNOSIS — Z87891 Personal history of nicotine dependence: Secondary | ICD-10-CM | POA: Diagnosis not present

## 2015-03-31 DIAGNOSIS — Z79899 Other long term (current) drug therapy: Secondary | ICD-10-CM | POA: Diagnosis not present

## 2015-03-31 DIAGNOSIS — Z792 Long term (current) use of antibiotics: Secondary | ICD-10-CM | POA: Insufficient documentation

## 2015-03-31 HISTORY — DX: Benign neoplasm of connective and other soft tissue, unspecified: D21.9

## 2015-03-31 LAB — URINALYSIS, ROUTINE W REFLEX MICROSCOPIC
Bilirubin Urine: NEGATIVE
GLUCOSE, UA: NEGATIVE mg/dL
Ketones, ur: NEGATIVE mg/dL
Nitrite: NEGATIVE
PH: 7 (ref 5.0–8.0)
Protein, ur: NEGATIVE mg/dL
SPECIFIC GRAVITY, URINE: 1.015 (ref 1.005–1.030)
Urobilinogen, UA: 1 mg/dL (ref 0.0–1.0)

## 2015-03-31 LAB — URINE MICROSCOPIC-ADD ON

## 2015-03-31 LAB — PREGNANCY, URINE: Preg Test, Ur: NEGATIVE

## 2015-03-31 MED ORDER — DIAZEPAM 5 MG PO TABS: 5.0000 mg | ORAL_TABLET | Freq: Four times a day (QID) | ORAL | Status: DC | PRN

## 2015-03-31 MED ORDER — KETOROLAC TROMETHAMINE 60 MG/2ML IM SOLN
60.0000 mg | Freq: Once | INTRAMUSCULAR | Status: AC
  Administered 2015-03-31: 60 mg via INTRAMUSCULAR
  Filled 2015-03-31: qty 2

## 2015-03-31 MED ORDER — KETOROLAC TROMETHAMINE 10 MG PO TABS: 10.0000 mg | ORAL_TABLET | Freq: Four times a day (QID) | ORAL | Status: DC | PRN

## 2015-03-31 MED ORDER — DIAZEPAM 5 MG PO TABS
10.0000 mg | ORAL_TABLET | Freq: Once | ORAL | Status: AC
  Administered 2015-03-31: 10 mg via ORAL
  Filled 2015-03-31: qty 2

## 2015-03-31 NOTE — ED Notes (Signed)
CT called to inform nurse that pt sent her boyfriend to store for candy for her when she got back from CT, CT explained to pt not to eat the candy but pt stated to CT that nobody had to know.

## 2015-03-31 NOTE — Discharge Instructions (Signed)

## 2015-03-31 NOTE — ED Provider Notes (Signed)
CSN: [id number]     Arrival date & time 03/31/15  1149 History   First MD Initiated Contact with Patient 03/31/15 1250     Chief Complaint  Patient presents with  . Abdominal Pain     (Consider location/radiation/quality/duration/timing/severity/associated sxs/prior Treatment) HPI Comments: Patient here with several days of right hip pain is worse than relation. Pain initially was thought to be abdominal in etiology and was seen by her primary care doctor referred to the ED several days ago. Review of those records show that she had a CT which showed a remote uterine fibroid. Follow-up at her GYN doctor's office yesterday those notes reviewed and her physician felt that her pain was musculoskeletal in etiology. Patient denies any urinary symptoms. No fever or chills. No vomiting or diarrhea. Pain Is sharp and worse with standing and better with rest. Has been using hydrocodone without relief.  Patient is a 34 y.o. female presenting with abdominal pain. The history is provided by the patient and the spouse.  Abdominal Pain   Past Medical History  Diagnosis Date  . Fibroid    Past Surgical History  Procedure Laterality Date  . Wisdom tooth extraction     No family history on file. Social History  Substance Use Topics  . Smoking status: Former Smoker    Types: Cigarettes    Quit date: 01/27/2015  . Smokeless tobacco: None  . Alcohol Use: Yes     Comment: socially   OB History    No data available     Review of Systems  Gastrointestinal: Positive for abdominal pain.  All other systems reviewed and are negative.     Allergies  Review of patient's allergies indicates no known allergies.  Home Medications   Prior to Admission medications   Medication Sig Start Date End Date Taking? Authorizing Provider  ciprofloxacin (CIPRO) 500 MG tablet Take 1 tablet by mouth 2 (two) times daily. 03/28/15   Historical Provider, MD  HYDROcodone-acetaminophen (NORCO/VICODIN) 5-325 MG  tablet Take 1 tablet by mouth every 4 (four) hours as needed. 03/28/15   Historical Provider, MD  ibuprofen (ADVIL,MOTRIN) 200 MG tablet Take 400 mg by mouth every 6 (six) hours as needed for moderate pain.    Historical Provider, MD  methocarbamol (ROBAXIN) 500 MG tablet Take 1 tablet (500 mg total) by mouth 4 (four) times daily. 03/30/15   Jonnie Kind, MD  oxyCODONE-acetaminophen (PERCOCET/ROXICET) 5-325 MG tablet Take 1 tablet by mouth every 4 (four) hours as needed. 03/29/15   Tammy Triplett, PA-C  promethazine (PHENERGAN) 25 MG tablet Take 1 tablet (25 mg total) by mouth every 6 (six) hours as needed for nausea or vomiting. Patient not taking: Reported on 03/30/2015 03/29/15   Tammy Triplett, PA-C   BP 113/90 mmHg  Pulse 91  Temp(Src) 98.4 F (36.9 C) (Oral)  Resp 18  Wt 250 lb (113.399 kg)  SpO2 100%  LMP 03/09/2015 Physical Exam  Constitutional: She is oriented to person, place, and time. She appears well-developed and well-nourished.  Non-toxic appearance. No distress.  HENT:  Head: Normocephalic and atraumatic.  Eyes: Conjunctivae, EOM and lids are normal. Pupils are equal, round, and reactive to light.  Neck: Normal range of motion. Neck supple. No tracheal deviation present. No thyroid mass present.  Cardiovascular: Normal rate, regular rhythm and normal heart sounds.  Exam reveals no gallop.   No murmur heard. Pulmonary/Chest: Effort normal and breath sounds normal. No stridor. No respiratory distress. She has no decreased breath sounds. She  has no wheezes. She has no rhonchi. She has no rales.  Abdominal: Soft. Normal appearance and bowel sounds are normal. She exhibits no distension. There is no rigidity, no rebound, no guarding and no CVA tenderness.  Musculoskeletal: Normal range of motion. She exhibits no edema or tenderness.  No shortening or rotation noted. Pain with extension and flexion at hip. Neurovascular intact distally.  Neurological: She is alert and oriented to  person, place, and time. She has normal strength. No cranial nerve deficit or sensory deficit. GCS eye subscore is 4. GCS verbal subscore is 5. GCS motor subscore is 6.  Skin: Skin is warm and dry. No abrasion and no rash noted.  Psychiatric: She has a normal mood and affect. Her speech is normal and behavior is normal.  Nursing note and vitals reviewed.   ED Course  Procedures (including critical care time) Labs Review Labs Reviewed  URINALYSIS, ROUTINE W REFLEX MICROSCOPIC (NOT AT [facility])  PREGNANCY, URINE    Imaging Review No results found. I have personally reviewed and evaluated these images and lab results as part of my medical decision-making.   EKG Interpretation None      MDM   Final diagnoses:  None   Patient given meds here. Much better. CT of her hip was negative for acute process. Suspect this is musculoskeletal strain. This is in agreement with the patient's gynecologist who saw her for similar symptoms. Patient stable for discharge    Lacretia Leigh, MD 03/31/15 1443

## 2015-03-31 NOTE — ED Notes (Signed)
Pt verbalized understanding of no driving and to use caution within 4 hours of taking pain meds due to meds cause drowsiness 

## 2015-03-31 NOTE — ED Notes (Signed)
MD at bedside. 

## 2015-03-31 NOTE — ED Notes (Signed)
Pt c/o of increasingly worse abdominal pain radiating to RT hip. Pt reports being seen a few days ago and diagnosed with a fibroid. Reports she followed up with OB/GYN per d/c instructions and they told her pain was not related to fibroid. Pt states pain is getting worse.

## 2015-03-31 NOTE — ED Provider Notes (Signed)
CSN: [id number]     Arrival date & time 03/29/15  0820 History   First MD Initiated Contact with Patient 03/29/15 [phone]     Chief Complaint  Patient presents with  . Abdominal Pain     (Consider location/radiation/quality/duration/timing/severity/associated sxs/prior Treatment) HPI  Gina Roth is a 34 y.o. female who presents to the Emergency Department complaining of lower abd pain and bloody urine.  She states the pain is sharp and been persistent for several days.  She states she was seen by her PMD and given cipro and vicodin and advised she had a UTI.  She states that pain medication is not controlling the pain.  She also notes her urine is darker than usual.  She denies vomiting, fever, diarrhea, vaginal bleeding.  She also denies burning with urination or hesitancy.     Past Medical History  Diagnosis Date  . Fibroid    Past Surgical History  Procedure Laterality Date  . Wisdom tooth extraction     History reviewed. No pertinent family history. Social History  Substance Use Topics  . Smoking status: Former Smoker    Types: Cigarettes    Quit date: 01/27/2015  . Smokeless tobacco: None  . Alcohol Use: Yes     Comment: socially   OB History    No data available     Review of Systems  Constitutional: Negative for fever, chills and appetite change.  Respiratory: Negative for shortness of breath.   Cardiovascular: Negative for chest pain.  Gastrointestinal: Positive for abdominal pain. Negative for nausea, vomiting and blood in stool.  Genitourinary: Positive for hematuria. Negative for dysuria, flank pain, decreased urine volume, vaginal bleeding and difficulty urinating.  Musculoskeletal: Negative for back pain.  Skin: Negative for color change and rash.  Neurological: Negative for dizziness, weakness and numbness.  Hematological: Negative for adenopathy.  All other systems reviewed and are negative.     Allergies  Review of patient's allergies indicates  no known allergies.  Home Medications   Prior to Admission medications   Medication Sig Start Date End Date Taking? Authorizing Provider  HYDROcodone-acetaminophen (NORCO/VICODIN) 5-325 MG tablet Take 1 tablet by mouth every 4 (four) hours as needed. 03/28/15  Yes Historical Provider, MD  diazepam (VALIUM) 5 MG tablet Take 1 tablet (5 mg total) by mouth every 6 (six) hours as needed for muscle spasms. 03/31/15   Lacretia Leigh, MD  ketorolac (TORADOL) 10 MG tablet Take 1 tablet (10 mg total) by mouth every 6 (six) hours as needed. 03/31/15   Lacretia Leigh, MD  oxyCODONE-acetaminophen (PERCOCET/ROXICET) 5-325 MG tablet Take 1 tablet by mouth every 4 (four) hours as needed. 03/29/15   Jamesmichael Shadd, PA-C  promethazine (PHENERGAN) 25 MG tablet Take 1 tablet (25 mg total) by mouth every 6 (six) hours as needed for nausea or vomiting. Patient not taking: Reported on 03/30/2015 03/29/15   Israa Caban, PA-C   BP 102/41 mmHg  Pulse 83  Temp(Src) 98.3 F (36.8 C) (Oral)  Resp 18  Ht 5\' 7"  (1.702 m)  Wt 215 lb (97.523 kg)  BMI 33.67 kg/m2  SpO2 100%  LMP 03/09/2015 Physical Exam  Constitutional: She is oriented to person, place, and time. She appears well-developed and well-nourished. No distress.  HENT:  Head: Normocephalic.  Mouth/Throat: Oropharynx is clear and moist.  Cardiovascular: Normal rate, regular rhythm and intact distal pulses.   Pulmonary/Chest: Effort normal and breath sounds normal. No respiratory distress.  Abdominal: Soft. Normal appearance. She exhibits no distension. There  is tenderness in the suprapubic area. There is no rebound, no guarding and no CVA tenderness.  Musculoskeletal: Normal range of motion.  Neurological: She is alert and oriented to person, place, and time. Coordination normal.  Skin: Skin is warm. No rash noted.  Nursing note and vitals reviewed.   ED Course  Procedures (including critical care time) Labs Review Labs Reviewed  URINALYSIS, ROUTINE W  REFLEX MICROSCOPIC (NOT AT [facility]) - Abnormal; Notable for the following:    Hgb urine dipstick MODERATE (*)    All other components within normal limits  URINE MICROSCOPIC-ADD ON - Abnormal; Notable for the following:    Squamous Epithelial / LPF FEW (*)    Bacteria, UA FEW (*)    All other components within normal limits  CBC WITH DIFFERENTIAL/PLATELET  BASIC METABOLIC PANEL  POC URINE PREG, ED    Imaging Review Ct Hip Right Wo Contrast  03/31/2015  CLINICAL DATA:  Right hip pain. Lower abdominal pain. No known injury. EXAM: CT OF THE RIGHT HIP WITHOUT CONTRAST TECHNIQUE: Multidetector CT imaging of the right hip was performed according to the standard protocol. Multiplanar CT image reconstructions were also generated. COMPARISON:  None. FINDINGS: No acute fracture or dislocation. No lytic or sclerotic osseous lesion. Joint spaces are maintained. Intact superior and inferior pubic rami. Prominence of the superolateral acetabulum as can be seen with femoroacetabular impingement. The soft tissues are normal. The muscles are normal. There is no muscle atrophy. There is no fluid collection or hematoma. There is no right inguinal hernia. There is no inguinal lymphadenopathy. There is no right adnexal mass. IMPRESSION: 1. No acute osseous injury of the right hip. Electronically Signed   By: Kathreen Devoid   On: 03/31/2015 14:27   I have personally reviewed and evaluated these images and lab results as part of my medical decision-making.   EKG Interpretation None      MDM   Final diagnoses:  Hematuria  Uterine leiomyoma, unspecified location    Pt is feeling better.  CT scan of abd shows uterine fibroid, confirmed by [place].  No concerning sx's for acute abdomen. U/A shows hematuria without infection. Pt appears stable for d/c and agrees to close f/u with GYN. Rx for percocet and phenergan     Kem Parkinson, PA-C 03/31/15 2114  Nat Christen, MD 04/03/15 1126

## 2016-09-30 IMAGING — CT CT HIP*R* W/O CM
3 of 4 series · 16 of 34 positions shown, 18 images · non-contrast
Comparison: None.

CLINICAL DATA: Right hip pain. Lower abdominal pain. No known
injury.

EXAM:
CT OF THE RIGHT HIP WITHOUT CONTRAST
TECHNIQUE: Multidetector CT imaging of the right hip was performed according to
the standard protocol. Multiplanar CT image reconstructions were
also generated.

[Series 6: axial soft · axial · 0.37mm/px · z∈[-290,-154]mm · 7 of 82 slices shown, 9 images]
[im 7/82  soft-tissue]
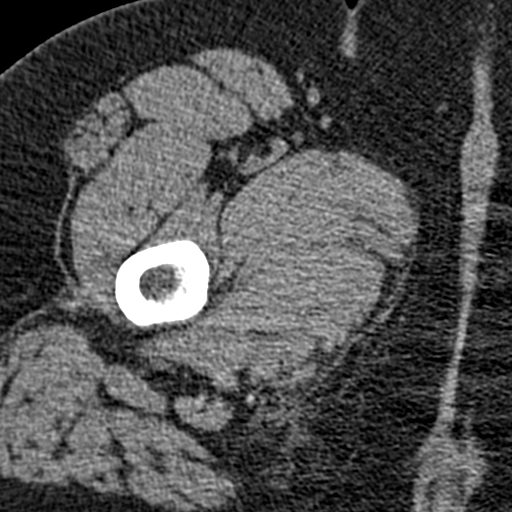
[im 7/82  bone]
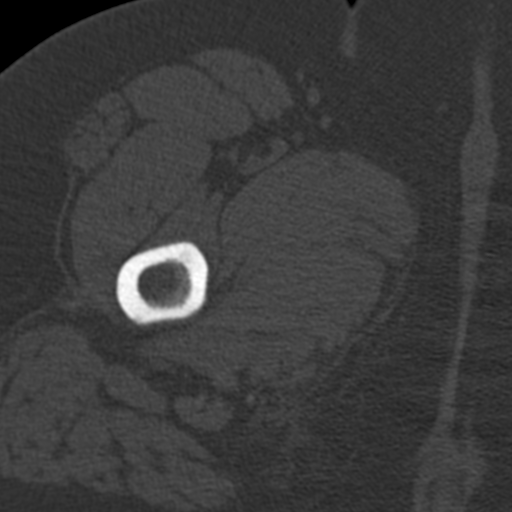
[im 19/82  bone]
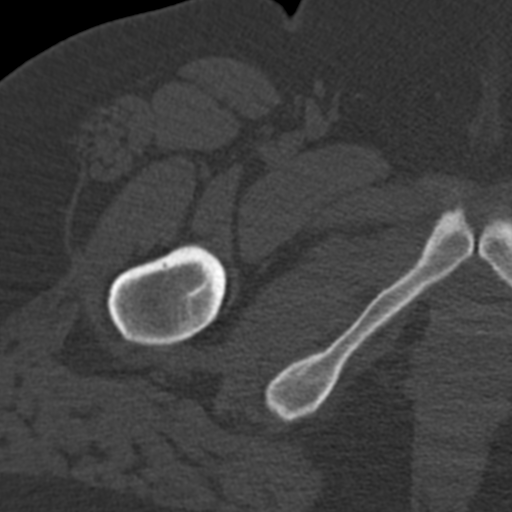
[im 32/82  bone]
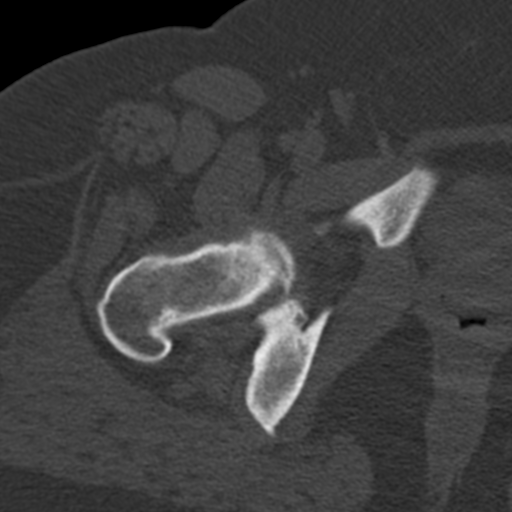
[im 44/82  bone]
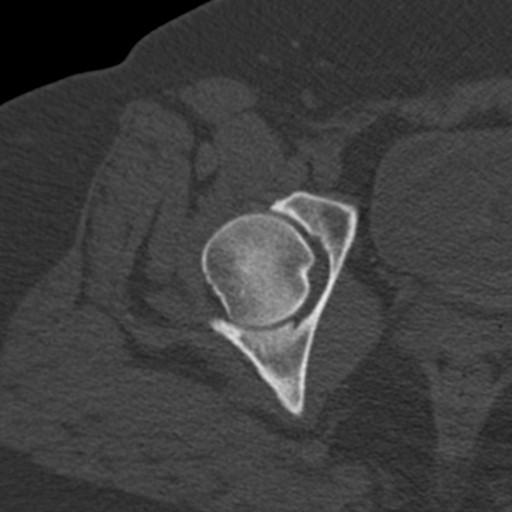
[im 50/82  soft-tissue]
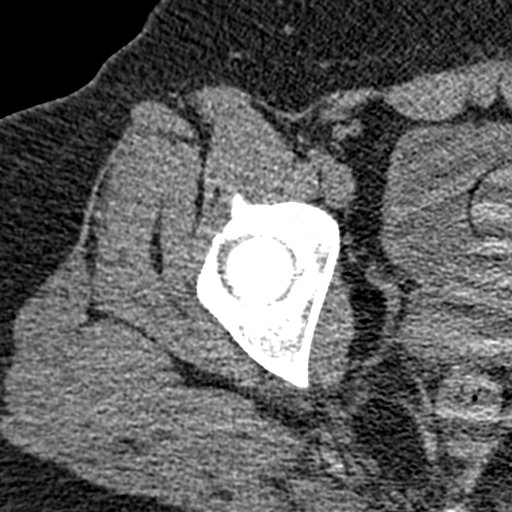
[im 50/82  bone]
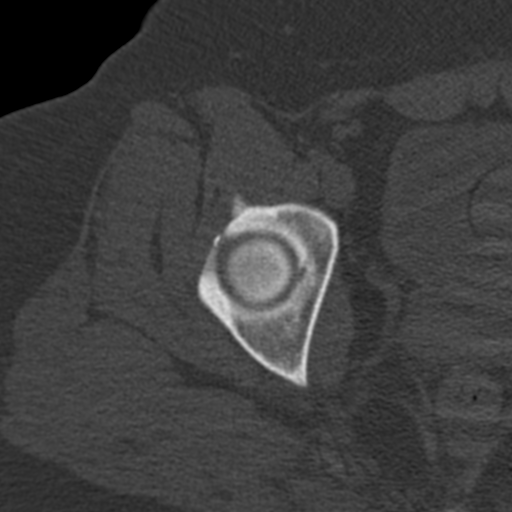
[im 63/82  bone]
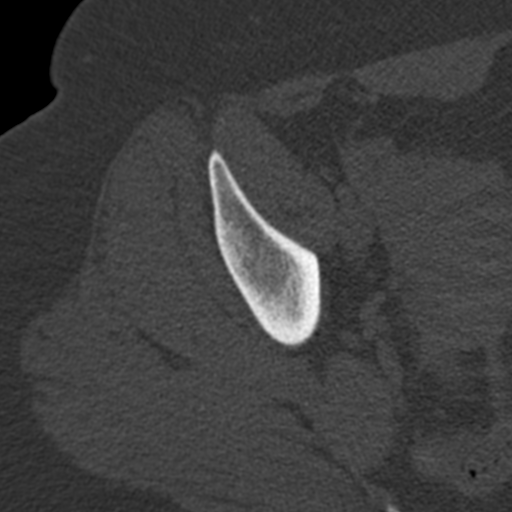
[im 75/82  bone]
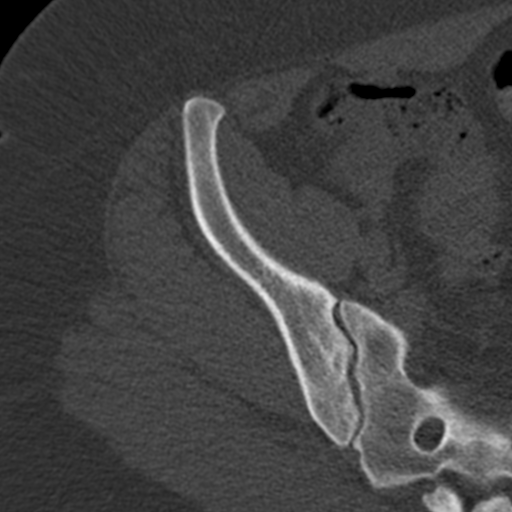

[Series 8: sagittal bone · sagittal · 0.33mm/px · 6 of 95 slices shown]
[im 6/95  bone]
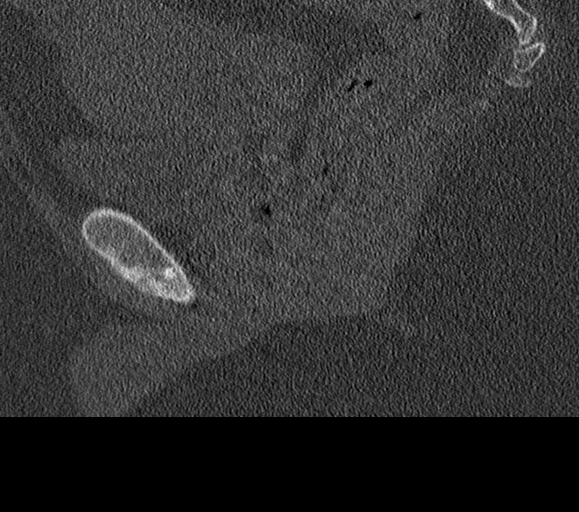
[im 22/95  bone]
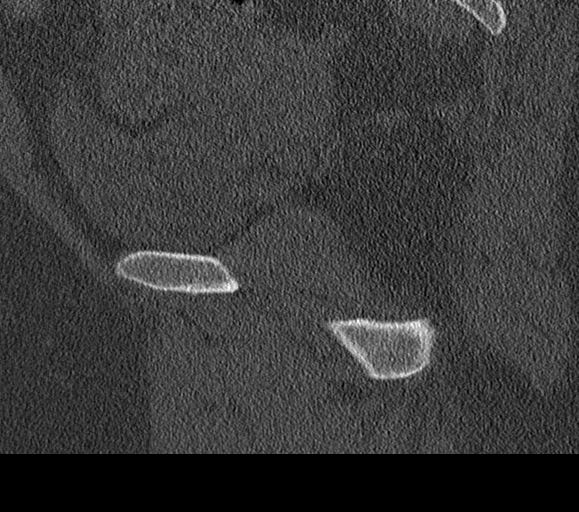
[im 39/95  bone]
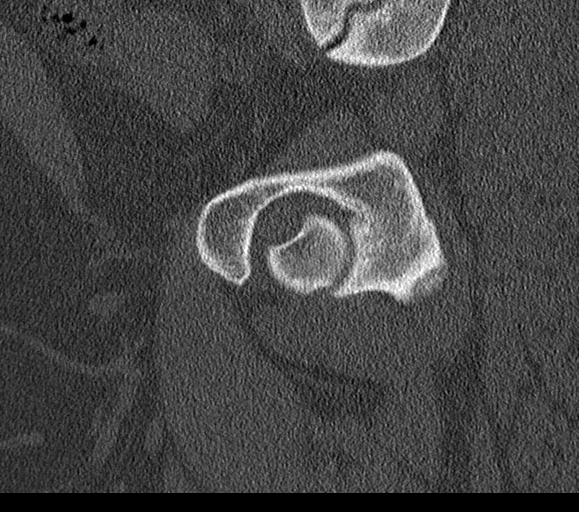
[im 41/95  soft-tissue]
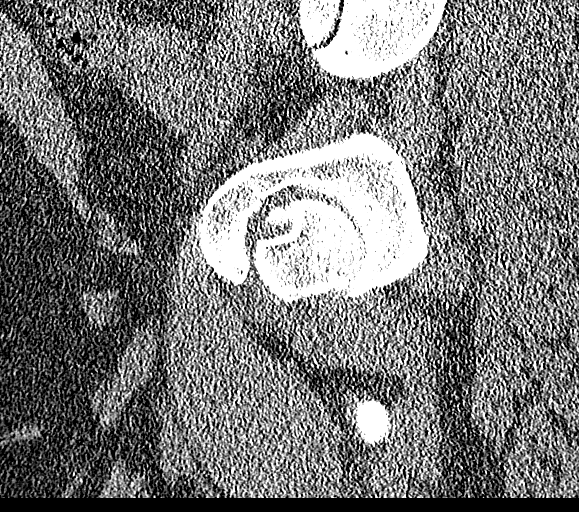
[im 56/95  bone]
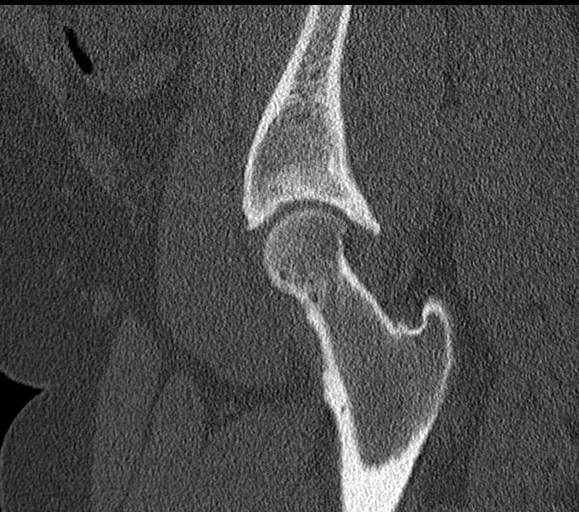
[im 73/95  bone]
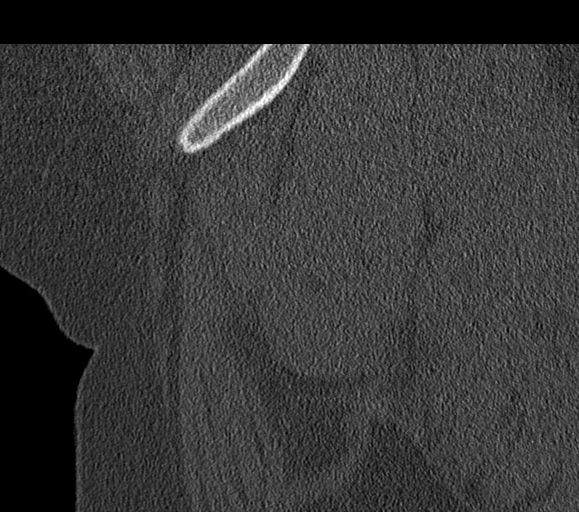

[Series 9: coronal soft · coronal · 0.32mm/px · 3 of 98 slices shown]
[im 20/98  bone]
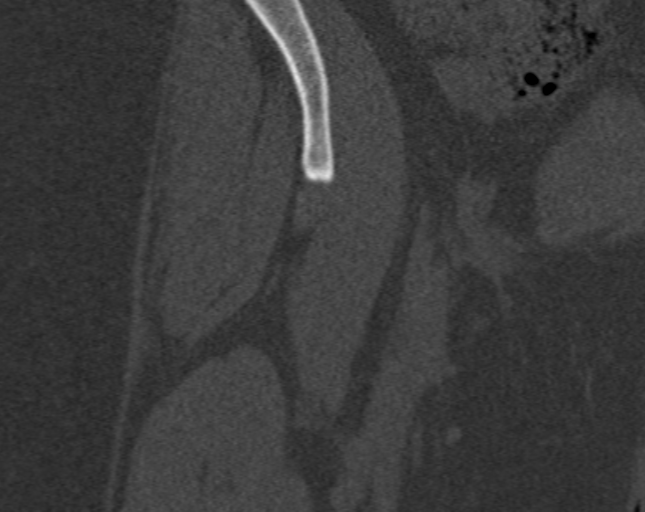
[im 39/98  bone]
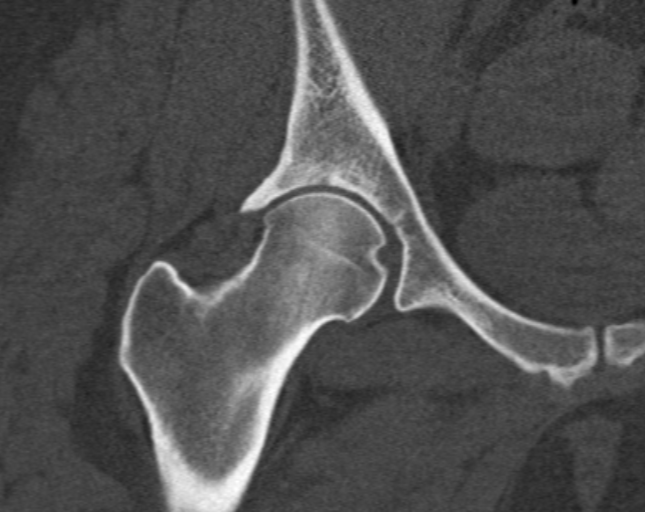
[im 59/98  bone]
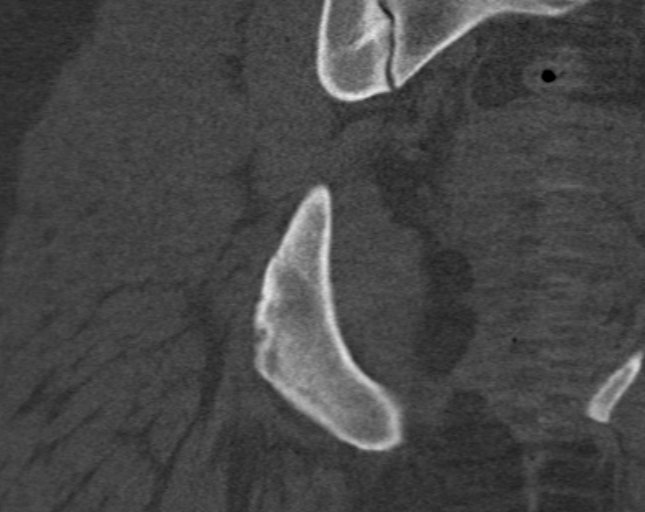

[16 of 34 positions shown; findings below may reference images not displayed]

FINDINGS: No acute fracture or dislocation. No lytic or sclerotic osseous
lesion. Joint spaces are maintained. Intact superior and inferior
pubic rami. Prominence of the superolateral acetabulum as can be
seen with femoroacetabular impingement.

The soft tissues are normal. The muscles are normal. There is no
muscle atrophy. There is no fluid collection or hematoma. There is
no right inguinal hernia. There is no inguinal lymphadenopathy.
There is no right adnexal mass.
IMPRESSION: 1. No acute osseous injury of the right hip.

## 2018-11-28 ENCOUNTER — Other Ambulatory Visit: Payer: Self-pay

## 2018-11-28 ENCOUNTER — Encounter (HOSPITAL_COMMUNITY): Payer: Self-pay | Admitting: Emergency Medicine

## 2018-11-28 ENCOUNTER — Emergency Department (HOSPITAL_COMMUNITY)
Admission: EM | Admit: 2018-11-28 | Discharge: 2018-11-28 | Disposition: A | Discharge status: Home / Self Care | Payer: BC Managed Care – PPO | Attending: Emergency Medicine | Admitting: Emergency Medicine

## 2018-11-28 DIAGNOSIS — R102 Pelvic and perineal pain: Secondary | ICD-10-CM | POA: Diagnosis present

## 2018-11-28 DIAGNOSIS — F17210 Nicotine dependence, cigarettes, uncomplicated: Secondary | ICD-10-CM | POA: Insufficient documentation

## 2018-11-28 DIAGNOSIS — N764 Abscess of vulva: Secondary | ICD-10-CM | POA: Insufficient documentation

## 2018-11-28 DIAGNOSIS — Z79899 Other long term (current) drug therapy: Secondary | ICD-10-CM | POA: Insufficient documentation

## 2018-11-28 MED ORDER — HYDROCODONE-ACETAMINOPHEN 5-325 MG PO TABS: 1.0000 | ORAL_TABLET | ORAL | 0 refills | Status: DC | PRN

## 2018-11-28 MED ORDER — DOXYCYCLINE HYCLATE 100 MG PO CAPS: 100.0000 mg | ORAL_CAPSULE | Freq: Two times a day (BID) | ORAL | 0 refills | Status: DC

## 2018-11-28 MED ORDER — BUPIVACAINE HCL (PF) 0.25 % IJ SOLN: 20.0000 mL | Freq: Once | INTRAMUSCULAR | Status: DC

## 2018-11-28 NOTE — ED Triage Notes (Signed)
Pt c/o of abscess to vaginal region, started about 8 days ago but hasn't been able to treat it at home.

## 2018-11-28 NOTE — Discharge Instructions (Signed)
Your abscess area began to drain after being in the emergency department.  Please use warm Epson salt soaks 1 or 2 times daily until this abscess has resolved.  Please use doxycycline 2 times daily with food.  Use Tylenol every 4 hours or ibuprofen every 6 hours for mild pain.  Use Norco for more severe pain. This medication may cause drowsiness. Please do not drink, drive, or participate in activity that requires concentration while taking this medication.  Please return to the emergency department or see your GYN physician if the abscess reoccurs or refills, or if there is fever, or chills.

## 2018-11-28 NOTE — ED Provider Notes (Signed)
[facility] EMERGENCY DEPARTMENT Provider Note   CSN: [id number] Arrival date & time: 11/28/18  2113     History   Chief Complaint Chief Complaint  Patient presents with  . Abscess    HPI Gina Roth is a 38 y.o. female.     Patient is a 38 year old female who presents to the emergency department with complaint of abscess in the vaginal area.  The patient states this has been going on for 7 or 8 days.  Patient states she has been using warm compresses and ibuprofen, but this seems to be getting worse instead of better.  She has not had any fever, vomiting, or chills.  There is been no drainage.  It is of note the patient has had previous problems with similar abscess areas, but patient states these usually go away after a couple of days of warm tub soaks.  The patient states the pain is getting worse and she just does not feel as though she can handle this any longer.  The history is provided by the patient.  Abscess Associated symptoms: no nausea and no vomiting     Past Medical History:  Diagnosis Date  . Fibroid     Patient Active Problem List   Diagnosis Date Noted  . Musculoskeletal pain 03/30/2015  . 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 WISDOM TOOTH EXTRACTION       OB History   No obstetric history on file.      Home Medications    Prior to Admission medications   Medication Sig Start Date End Date Taking? Authorizing Provider  diazepam (VALIUM) 5 MG tablet Take 1 tablet (5 mg total) by mouth every 6 (six) hours as needed for muscle spasms. 03/31/15   Lacretia Leigh, MD  HYDROcodone-acetaminophen (NORCO/VICODIN) 5-325 MG tablet Take 1 tablet by mouth every 4 (four) hours as needed. 03/28/15   [provider]  ketorolac (TORADOL) 10 MG tablet Take 1 tablet (10 mg total) by mouth every 6 (six) hours as needed. 03/31/15   Lacretia Leigh, MD  oxyCODONE-acetaminophen (PERCOCET/ROXICET)  5-325 MG tablet Take 1 tablet by mouth every 4 (four) hours as needed. 03/29/15   Triplett, Tammy, PA-C  promethazine (PHENERGAN) 25 MG tablet Take 1 tablet (25 mg total) by mouth every 6 (six) hours as needed for nausea or vomiting. Patient not taking: Reported on 03/30/2015 03/29/15   Kem Parkinson, PA-C    Family History No family history on file.  Social History Social History   Tobacco Use  . Smoking status: Current Every Day Smoker    Types: Cigarettes  . Smokeless tobacco: Never Used  Substance Use Topics  . Alcohol use: Yes    Comment: socially  . Drug use: No    Types: Marijuana    Comment: last used 04/25/12 - 04/26/12     Allergies   Patient has no known allergies.   Review of Systems Review of Systems  Constitutional: Negative for activity change and appetite change.  HENT: Negative for congestion, ear discharge, ear pain, facial swelling, nosebleeds, rhinorrhea, sneezing and tinnitus.   Eyes: Negative for photophobia, pain and discharge.  Respiratory: Negative for cough, choking, shortness of breath and wheezing.   Cardiovascular: Negative for chest pain, palpitations and leg swelling.  Gastrointestinal: Negative for abdominal pain, blood in stool, constipation, diarrhea, nausea and vomiting.  Genitourinary: Negative for difficulty urinating, dysuria, flank pain, frequency and hematuria.  Musculoskeletal: Negative for  back pain, gait problem, myalgias and neck pain.  Skin: Negative for color change, rash and wound.       Abscess  Neurological: Negative for dizziness, seizures, syncope, facial asymmetry, speech difficulty, weakness and numbness.  Hematological: Negative for adenopathy. Does not bruise/bleed easily.  Psychiatric/Behavioral: Negative for agitation, confusion, hallucinations, self-injury and suicidal ideas. The patient is not nervous/anxious.      Physical Exam Updated Vital Signs BP (!) 128/94 (BP Location: Right Arm)   Pulse (!) 117   Temp  98.6 F (37 C) (Oral)   Resp 16   Ht 5\' 8"  (1.727 m)   Wt 108.9 kg   SpO2 99%   BMI 36.49 kg/m   Physical Exam Vitals signs and nursing note reviewed. Exam conducted with a chaperone present.  Constitutional:      Appearance: She is well-developed. She is not toxic-appearing.  HENT:     Head: Normocephalic.     Right Ear: Tympanic membrane and external ear normal.     Left Ear: Tympanic membrane and external ear normal.  Eyes:     General: Lids are normal.     Pupils: Pupils are equal, round, and reactive to light.  Neck:     Musculoskeletal: Normal range of motion and neck supple.     Vascular: No carotid bruit.  Cardiovascular:     Rate and Rhythm: Normal rate and regular rhythm.     Pulses: Normal pulses.     Heart sounds: Normal heart sounds.  Pulmonary:     Effort: No respiratory distress.     Breath sounds: Normal breath sounds.  Abdominal:     General: Bowel sounds are normal.     Palpations: Abdomen is soft.     Tenderness: There is no abdominal tenderness. There is no guarding.  Genitourinary:    Exam position: Supine.     Labia:        Right: Tenderness present.     Musculoskeletal: Normal range of motion.  Lymphadenopathy:     Head:     Right side of head: No submandibular adenopathy.     Left side of head: No submandibular adenopathy.     Cervical: No cervical adenopathy.     Lower Body: No right inguinal adenopathy. No left inguinal adenopathy.  Skin:    General: Skin is warm and dry.  Neurological:     Mental Status: She is alert and oriented to person, place, and time.     Cranial Nerves: No cranial nerve deficit.     Sensory: No sensory deficit.  Psychiatric:        Speech: Speech normal.      ED Treatments / Results  Labs (all labs ordered are listed, but only abnormal results are displayed) Labs Reviewed - No data to display  EKG None  Radiology No results found.  Procedures Procedures (including critical care time)   Medications Ordered in ED Medications - No data to display   Initial Impression / Assessment and Plan / ED Course  I have reviewed the triage vital signs and the nursing notes.  Pertinent labs & imaging results that were available during my care of the patient were reviewed by me and considered in my medical decision making (see chart for details).          Final Clinical Impressions(s) / ED Diagnoses MDM  Heart rate is elevated at 117, blood pressure is elevated at 128/94.  The pulse oximetry is 99% on room air.  Within  normal limits by my interpretation.  Patient has been dealing with abscess of the right upper labial area for the last 7 or 8 days.  The pain has gotten worse and patient wanted this evaluated and asking for assistance with this.  Upon my initial visit to the room, there was no drainage, but shortly after leaving to get a chaperone the abscess area broke loose and was draining.  The area was examined with chaperone present.  The abscess area is not a candidate for any additional incision and drainage at this time.  I have asked the patient to use warm Epson salt soaks 1 or 2 times daily until the abscess area has resolved.  I have asked her to return if the abscess feels back up or there is fever or chills or deterioration in her general condition.  Prescription for doxycycline, and Norco given to the patient.  Patient will return if any changes, problems, or signs of advancing infection.   Final diagnoses:  Abscess of right genital labia    ED Discharge Orders    None       Lily Kocher, PA-C 11/28/18 2323    Sherwood Gambler, MD 11/29/18 [phone]

## 2018-11-28 NOTE — ED Notes (Addendum)
Since PA has been in to see pt, abscess had opened on it's own and drained some per pt.

## 2018-11-30 MED FILL — Hydrocodone-Acetaminophen Tab 5-325 MG: ORAL | Qty: 6 | Status: AC

## 2019-04-09 ENCOUNTER — Other Ambulatory Visit: Payer: Self-pay

## 2019-04-09 DIAGNOSIS — Z20822 Contact with and (suspected) exposure to covid-19: Secondary | ICD-10-CM

## 2019-04-12 LAB — NOVEL CORONAVIRUS, NAA: SARS-CoV-2, NAA: NOT DETECTED

## 2020-03-19 ENCOUNTER — Emergency Department (HOSPITAL_COMMUNITY): Payer: Self-pay

## 2020-03-19 ENCOUNTER — Other Ambulatory Visit: Payer: Self-pay

## 2020-03-19 ENCOUNTER — Emergency Department (HOSPITAL_COMMUNITY)
Admission: EM | Admit: 2020-03-19 | Discharge: 2020-03-19 | Disposition: A | Discharge status: Home / Self Care | Payer: Self-pay | Attending: Emergency Medicine | Admitting: Emergency Medicine

## 2020-03-19 ENCOUNTER — Encounter (HOSPITAL_COMMUNITY): Payer: Self-pay

## 2020-03-19 DIAGNOSIS — F17210 Nicotine dependence, cigarettes, uncomplicated: Secondary | ICD-10-CM | POA: Insufficient documentation

## 2020-03-19 DIAGNOSIS — M898X1 Other specified disorders of bone, shoulder: Secondary | ICD-10-CM

## 2020-03-19 DIAGNOSIS — K648 Other hemorrhoids: Secondary | ICD-10-CM | POA: Insufficient documentation

## 2020-03-19 DIAGNOSIS — R590 Localized enlarged lymph nodes: Secondary | ICD-10-CM | POA: Insufficient documentation

## 2020-03-19 DIAGNOSIS — K644 Residual hemorrhoidal skin tags: Secondary | ICD-10-CM

## 2020-03-19 MED ORDER — HYDROCORTISONE (PERIANAL) 2.5 % EX CREA: 1.0000 "application " | TOPICAL_CREAM | Freq: Two times a day (BID) | CUTANEOUS | 0 refills | Status: DC

## 2020-03-19 NOTE — ED Triage Notes (Signed)
Pt presents to ED with complaints of hemorrhoids x 2 weeks. Pt also c/o knot around the left clavicle came up today.

## 2020-03-19 NOTE — Discharge Instructions (Addendum)
You had a small external hemorrhoid on exam.  These can be very painful but typically resolve.  You may try applying ice packs on and off to the area to help reduce swelling.  You may also try sitz bath using warm water and Epson salt.  Is very important that you keep your stools loose.  I would recommend taking over-the-counter MiraLAX once daily until your hemorrhoid resolves.  Apply the cream as directed.  You also likely have a enlarged lymph node that may be related to your recent Covid booster vaccine.  Try taking Tylenol or ibuprofen for pain control.  The swelling should resolve in several days, but if not follow-up with your primary care provider.  Return to the emergency department if you develop any worsening symptoms such as abdominal pain, fever, or increasing rectal pain.

## 2020-03-20 NOTE — ED Provider Notes (Signed)
[facility] EMERGENCY DEPARTMENT Provider Note   CSN: [id number] Arrival date & time: 03/19/20  1811     History Chief Complaint  Patient presents with  . Hemorrhoids  . Clavicle pain    Gina Roth is a 39 y.o. female.  HPI      Gina Roth is a 39 y.o. female who presents to the Emergency Department complaining of rectal pain secondary to hemrrhoids and pain and swelling to her left collarbone.  She describes straining to have a bowel movement and problems with constipation for some time.  She noticed an area of pain and swelling to her rectum for 2 weeks.  She applied preparation H externally without relief.  Pain worsens with defecation.  She denies abdominal pain, fever, vomiting and rectal bleeding.  Has history of previous hemorrhoids.  She also complains of gradually worsening pain to her left shoulder and clavicle area x 3 days.  She received her covid 19 booster on Friday and woke this morning and noticed a "knot" to her medial clavicle that is very tender to palpation.  Denies rash and other areas of swelling to the neck, axilla.  No known injury.  She did receive her booster injection in the left arm.     Past Medical History:  Diagnosis Date  . Fibroid     Patient Active Problem List   Diagnosis Date Noted  . Musculoskeletal pain 03/30/2015  . 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 WISDOM TOOTH EXTRACTION       OB History   No obstetric history on file.     No family history on file.  Social History   Tobacco Use  . Smoking status: Current Every Day Smoker    Packs/day: 0.50    Types: Cigarettes  . Smokeless tobacco: Never Used  Substance Use Topics  . Alcohol use: Yes    Comment: socially  . Drug use: No    Types: Marijuana    Comment: last used 04/25/12 - 04/26/12    Home Medications Prior to Admission medications   Medication Sig Start Date End Date Taking? Authorizing  Provider  hydrocortisone (ANUSOL-HC) 2.5 % rectal cream Place 1 application rectally 2 (two) times daily. 03/19/20   Frederick Klinger, PA-C    Allergies    Patient has no known allergies.  Review of Systems   Review of Systems  Constitutional: Negative for chills, fatigue and fever.  HENT: Negative for congestion, facial swelling, sore throat and trouble swallowing.   Respiratory: Negative for cough, shortness of breath and wheezing.   Cardiovascular: Negative for chest pain and palpitations.  Gastrointestinal: Positive for constipation and rectal pain. Negative for abdominal pain, blood in stool, nausea and vomiting.  Genitourinary: Negative for dysuria, flank pain and hematuria.  Musculoskeletal: Negative for arthralgias, back pain, myalgias, neck pain and neck stiffness.  Skin: Negative for rash.  Neurological: Negative for dizziness, weakness, numbness and headaches.  Hematological: Does not bruise/bleed easily.    Physical Exam Updated Vital Signs BP 111/86 (BP Location: Right Arm)   Pulse 96   Temp 98.7 F (37.1 C) (Oral)   Resp 18   Ht 5\' 8"  (1.727 m)   Wt 98.4 kg   LMP 03/17/2020   SpO2 100%   BMI 32.99 kg/m   Physical Exam Vitals and nursing note reviewed.  Constitutional:      General: She is not in acute distress.    Appearance:  Normal appearance. She is not ill-appearing.  HENT:     Mouth/Throat:     Mouth: Mucous membranes are moist.  Cardiovascular:     Rate and Rhythm: Normal rate and regular rhythm.     Pulses: Normal pulses.  Pulmonary:     Effort: Pulmonary effort is normal.     Breath sounds: Normal breath sounds.  Abdominal:     Palpations: Abdomen is soft.     Tenderness: There is no abdominal tenderness.  Genitourinary:    Rectum: Tenderness and external hemorrhoid present. No anal fissure. Normal anal tone.     Comments: 2-3 cm non thrombosed external hemorrhoid.  No anal fissures or bleeding. no surrounding area of fluctuance.  DRE exam  deferred due to pt's level of pain. Musculoskeletal:     Cervical back: Normal range of motion. No rigidity or tenderness.     Comments: Patient has a dime sized slightly raised area of tenderness at the medial clavicle that is ttp.  No fluctuance or erythema.    Lymphadenopathy:     Cervical: No cervical adenopathy.  Skin:    Findings: No erythema or rash.  Neurological:     General: No focal deficit present.     Mental Status: She is alert.     Sensory: No sensory deficit.     Motor: No weakness.     ED Results / Procedures / Treatments   Labs (all labs ordered are listed, but only abnormal results are displayed) Labs Reviewed - No data to display  EKG None  Radiology DG Clavicle Left  Result Date: 03/19/2020 CLINICAL DATA:  Pain of left clavicle. Knot around left clavicle. Patient reports onset after getting COVID booster. EXAM: LEFT CLAVICLE - 2+ VIEWS COMPARISON:  None. FINDINGS: There is no evidence of fracture or other focal bone lesions. Acromioclavicular joint is congruent with trace inferior spurring. There is no focal soft tissue abnormality. IMPRESSION: No radiographic abnormality of the left clavicle. Trace inferior acromioclavicular spurring. No radiographic explanation for knot. Electronically Signed   By: Keith Rake M.D.   On: 03/19/2020 20:00    Procedures Procedures (including critical care time)  Medications Ordered in ED Medications - No data to display  ED Course  I have reviewed the triage vital signs and the nursing notes.  Pertinent labs & imaging results that were available during my care of the patient were reviewed by me and considered in my medical decision making (see chart for details).    MDM Rules/Calculators/A&P                          Pt here with rectal pain secondary to a non thrombosed hemorrhoid.  Hx of same.  No clinical findings to suggest rectal abscess.  abd is soft and NT.  She also has a localized area of tenderness to  the medial clavicle. Area is slightly raised. No erythema or fluctuance.  She did received her covid booster on Friday to the left arm.  This may represent early developing abscess vs more likely lymphadenopathy from her recent vaccine. Doubt malignancy at this point given sudden onset and absence of other symptoms.   I have discussed with the pt and she agrees to monitor closely and f/u with PCP if sx's not improving.    Also discussed importance of proper diet and hydration to relieve constipation, increase dietary fiber and rx written for Anusol HC cream, she agrees to sitz baths as well.  Final Clinical Impression(s) / ED Diagnoses Final diagnoses:  External hemorrhoids without complication  Lymphadenopathy, supraclavicular    Rx / DC Orders ED Discharge Orders         Ordered    hydrocortisone (ANUSOL-HC) 2.5 % rectal cream  2 times daily        03/19/20 2159           Kem Parkinson, PA-C 03/20/20 2325    Wyvonnia Dusky, MD 03/21/20 1149

## 2021-02-21 ENCOUNTER — Other Ambulatory Visit: Payer: Self-pay | Admitting: Physician Assistant

## 2021-02-21 DIAGNOSIS — Z1231 Encounter for screening mammogram for malignant neoplasm of breast: Secondary | ICD-10-CM

## 2021-04-16 ENCOUNTER — Ambulatory Visit: Payer: Self-pay

## 2021-05-17 ENCOUNTER — Ambulatory Visit
Admission: RE | Admit: 2021-05-17 | Discharge: 2021-05-17 | Disposition: A | Discharge status: Home / Self Care | Payer: BC Managed Care – PPO | Source: Ambulatory Visit | Attending: Physician Assistant | Admitting: Physician Assistant

## 2021-05-17 DIAGNOSIS — Z1231 Encounter for screening mammogram for malignant neoplasm of breast: Secondary | ICD-10-CM

## 2021-06-18 ENCOUNTER — Telehealth: Payer: Self-pay

## 2021-06-18 NOTE — Telephone Encounter (Signed)
NOTES SCANNED TO REFERRRAL 

## 2021-07-10 ENCOUNTER — Ambulatory Visit: Payer: BC Managed Care – PPO | Admitting: Cardiovascular Disease

## 2021-07-10 ENCOUNTER — Encounter: Payer: Self-pay | Admitting: Cardiovascular Disease

## 2021-07-10 ENCOUNTER — Other Ambulatory Visit: Payer: Self-pay

## 2021-07-10 VITALS — BP 120/90 | HR 85 | Ht 68.0 in | Wt 247.4 lb

## 2021-07-10 DIAGNOSIS — R55 Syncope and collapse: Secondary | ICD-10-CM

## 2021-07-10 NOTE — Progress Notes (Signed)
Cardiology Office Note:    Date:  07/10/2021   ID:  Gina Roth, DOB Jun 13, 1980, MRN [id number]  PCP:  Stephens Shire, MD   [facility] Providers Cardiologist:  None {    Referring MD: Heywood Bene, *   Chief Complaint  Patient presents with   Dizziness     History of Present Illness:    Gina Roth is a 41 y.o. female with a hx of  syncope, hypotension , mildly prolonged QT interval QTc is .485 ( normal =0.460)   Was seen with her husband,  [name]   Has had 3 episodes of dizziness. The last time was at [employer].  Typically lasts for 10 minutes,   the last episode lasted 30 min Had not eaten yet   Did not pass out completely   Previous episode was several months prior .    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   No CP or dyspnea, or presyncope with exertion  These typically occur during her period - which has been have heavy .   Has fibroids also   BP typically runs low  Drinks 4-5 bottles of water  2 20 oz sodas ( no caffeine) not diet    Past Medical History:  Diagnosis Date   Dizziness    Fibroid    Hypotension    Lightheadedness    Nausea    Sweaty skin    Syncopal episodes     Past Surgical History:  Procedure Laterality Date   WISDOM TOOTH EXTRACTION      Current Medications: No outpatient medications have been marked as taking for the 07/10/21 encounter (Office Visit) with Anabeth Chilcott, Wonda Cheng, MD.     Allergies:   Patient has no known allergies.   Social History   Socioeconomic History   Marital status: Married    Spouse name: Not on file   Number of children: Not on file   Years of education: Not on file   Highest education level: Not on file  Occupational History   Not on file  Tobacco Use   Smoking status: Every Day    Packs/day: 0.50    Types: Cigarettes   Smokeless tobacco: Never  Substance and Sexual Activity   Alcohol use: Yes    Comment:  socially   Drug use: No    Types: Marijuana    Comment: last used 04/25/12 - 04/26/12   Sexual activity: Yes    Birth control/protection: None  Other Topics Concern   Not on file  Social History Narrative   Not on file   Social Determinants of Health   Financial Resource Strain: Not on file  Food Insecurity: Not on file  Transportation Needs: Not on file  Physical Activity: Not on file  Stress: Not on file  Social Connections: Not on file     Family History: The patient's family history is not on file.  ROS:   Please see the history of present illness.     All other systems reviewed and are negative.  EKGs/Labs/Other Studies Reviewed:    The following studies were reviewed today:   EKG: July 10, 2021: Normal sinus rhythm at 85.  No ST or T wave changes.  Her QTc is very minimally prolonged at 0.483.  ( normal = 0.460)   Recent Labs: No results found for requested labs within last 8760 hours.  Recent Lipid Panel No  results found for: CHOL, TRIG, HDL, CHOLHDL, VLDL, LDLCALC, LDLDIRECT   Risk Assessment/Calculations:           Physical Exam:    VS:  Pulse 85    Ht 5\' 8"  (1.727 m)    Wt 247 lb 6.4 oz (112.2 kg)    SpO2 97%    BMI 37.62 kg/m     Wt Readings from Last 3 Encounters:  07/10/21 247 lb 6.4 oz (112.2 kg)  03/19/20 217 lb (98.4 kg)  11/28/18 240 lb (108.9 kg)     GEN: moderately obese , young female, in no acute distress HEENT: Normal NECK: No JVD; No carotid bruits LYMPHATICS: No lymphadenopathy CARDIAC: RR , no murmur  RESPIRATORY:  Clear to auscultation without rales, wheezing or rhonchi  ABDOMEN: Soft, non-tender, non-distended MUSCULOSKELETAL:  No edema; No deformity  SKIN: Warm and dry NEUROLOGIC:  Alert and oriented x 3 PSYCHIATRIC:  Normal affect   ASSESSMENT:    1. Pre-syncope    PLAN:    In order of problems listed above:  Dizziness: Patient presents with episodic dizziness.  He is always occur during her period .  She has  been having very heavy menstrual cycles for the past several months.  I have encouraged her to talk to her medical doctor and GYN doctor about this. I encouraged her to hydrate better using Nuun tablets, liquid IV, V8, Gatorade.  I encouraged her to eat more protein.  Have encouraged her to eliminate her intake of sodas and other sweet drinks.  I think this will help with her episodes of dizziness.  2.  Prolonged QT: She has never had any true syncope.  There is no family history of sudden cardiac death.  Her QT interval is only barely above the normal range and I do not think this needs any further evaluation.          Medication Adjustments/Labs and Tests Ordered: Current medicines are reviewed at length with the patient today.  Concerns regarding medicines are outlined above.  Orders Placed This Encounter  Procedures   EKG 12-Lead   No orders of the defined types were placed in this encounter.   Patient Instructions  Increase your intake of fluids (water with electrolyte tabs like Nuun tablets,  Liquid IV or gatorade), protein (hard boiled eggs, chicken, fish), and a electrolytes ( V-8 juice, salt, potassium chloride  which is sold as No-Salt  Medication Instructions:  Your physician recommends that you continue on your current medications as directed. Please refer to the Current Medication list given to you today.  *If you need a refill on your cardiac medications before your next appointment, please call your pharmacy*  Follow-Up: At [facility], you and your health needs are our priority.  As part of our continuing mission to provide you with exceptional heart care, we have created designated Provider Care Teams.  These Care Teams include your primary Cardiologist (physician) and Advanced Practice Providers (APPs -  Physician Assistants and Nurse Practitioners) who all work together to provide you with the care you need, when you need it.  Follow up with Dr. Acie Fredrickson as  needed.    Signed, Mertie Moores, MD  07/10/2021 3:34 PM    [place]

## 2021-07-10 NOTE — Patient Instructions (Addendum)
Increase your intake of fluids (water with electrolyte tabs like Nuun tablets,  Liquid IV or gatorade), protein (hard boiled eggs, chicken, fish), and a electrolytes ( V-8 juice, salt, potassium chloride  which is sold as No-Salt  Medication Instructions:  Your physician recommends that you continue on your current medications as directed. Please refer to the Current Medication list given to you today.  *If you need a refill on your cardiac medications before your next appointment, please call your pharmacy*  Follow-Up: At [facility], you and your health needs are our priority.  As part of our continuing mission to provide you with exceptional heart care, we have created designated Provider Care Teams.  These Care Teams include your primary Cardiologist (physician) and Advanced Practice Providers (APPs -  Physician Assistants and Nurse Practitioners) who all work together to provide you with the care you need, when you need it.  Follow up with Dr. Acie Fredrickson as needed.
